# Patient Record
Sex: Female | Born: 1960 | Race: White | Hispanic: No | Marital: Single | Smoking: Former smoker
Health system: Southern US, Community
[De-identification: ages and names within clinical notes are randomized; demographics above are authoritative.]

## PROBLEM LIST (undated history)

## (undated) DIAGNOSIS — E039 Hypothyroidism, unspecified: Secondary | ICD-10-CM

## (undated) DIAGNOSIS — E559 Vitamin D deficiency, unspecified: Secondary | ICD-10-CM

## (undated) DIAGNOSIS — E063 Autoimmune thyroiditis: Secondary | ICD-10-CM

## (undated) DIAGNOSIS — C50919 Malignant neoplasm of unspecified site of unspecified female breast: Secondary | ICD-10-CM

## (undated) DIAGNOSIS — D0512 Intraductal carcinoma in situ of left breast: Secondary | ICD-10-CM

## (undated) DIAGNOSIS — Z923 Personal history of irradiation: Secondary | ICD-10-CM

## (undated) HISTORY — PX: COLONOSCOPY: SHX174

---
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
## 2005-02-16 HISTORY — PX: BREAST CYST ASPIRATION: SHX578

## 2006-03-28 HISTORY — PX: CERVICAL POLYPECTOMY: SHX88

## 2006-08-27 ENCOUNTER — Ambulatory Visit: Payer: Self-pay | Admitting: Orthopaedic Surgery

## 2009-10-12 ENCOUNTER — Ambulatory Visit: Payer: Self-pay | Admitting: Obstetrics and Gynecology

## 2009-10-26 ENCOUNTER — Ambulatory Visit: Payer: Self-pay | Admitting: Obstetrics and Gynecology

## 2009-10-29 ENCOUNTER — Ambulatory Visit: Payer: Self-pay | Admitting: Obstetrics and Gynecology

## 2010-11-29 ENCOUNTER — Ambulatory Visit: Payer: Self-pay | Admitting: Obstetrics and Gynecology

## 2012-03-29 ENCOUNTER — Ambulatory Visit: Payer: Self-pay | Admitting: Obstetrics and Gynecology

## 2013-04-16 ENCOUNTER — Ambulatory Visit: Payer: Self-pay | Admitting: Obstetrics and Gynecology

## 2014-05-29 ENCOUNTER — Ambulatory Visit: Payer: Self-pay | Admitting: Obstetrics and Gynecology

## 2015-07-05 DIAGNOSIS — E538 Deficiency of other specified B group vitamins: Secondary | ICD-10-CM | POA: Insufficient documentation

## 2015-07-05 DIAGNOSIS — E039 Hypothyroidism, unspecified: Secondary | ICD-10-CM | POA: Insufficient documentation

## 2015-07-05 DIAGNOSIS — E559 Vitamin D deficiency, unspecified: Secondary | ICD-10-CM | POA: Insufficient documentation

## 2015-08-11 ENCOUNTER — Other Ambulatory Visit: Payer: Self-pay | Admitting: Obstetrics and Gynecology

## 2015-08-11 DIAGNOSIS — Z1231 Encounter for screening mammogram for malignant neoplasm of breast: Secondary | ICD-10-CM

## 2015-08-31 ENCOUNTER — Ambulatory Visit
Admission: RE | Admit: 2015-08-31 | Discharge: 2015-08-31 | Disposition: A | Discharge status: Home / Self Care | Payer: BLUE CROSS/BLUE SHIELD | Source: Ambulatory Visit | Attending: Obstetrics and Gynecology | Admitting: Obstetrics and Gynecology

## 2015-08-31 DIAGNOSIS — Z1231 Encounter for screening mammogram for malignant neoplasm of breast: Secondary | ICD-10-CM | POA: Diagnosis not present

## 2016-10-02 ENCOUNTER — Other Ambulatory Visit: Payer: Self-pay | Admitting: Obstetrics and Gynecology

## 2016-10-02 DIAGNOSIS — Z1231 Encounter for screening mammogram for malignant neoplasm of breast: Secondary | ICD-10-CM

## 2016-10-20 ENCOUNTER — Ambulatory Visit
Admission: RE | Admit: 2016-10-20 | Discharge: 2016-10-20 | Disposition: A | Discharge status: Home / Self Care | Payer: BLUE CROSS/BLUE SHIELD | Source: Ambulatory Visit | Attending: Obstetrics and Gynecology | Admitting: Obstetrics and Gynecology

## 2016-10-20 DIAGNOSIS — Z1231 Encounter for screening mammogram for malignant neoplasm of breast: Secondary | ICD-10-CM | POA: Diagnosis not present

## 2017-03-30 ENCOUNTER — Ambulatory Visit: Admit: 2017-03-30 | Payer: BLUE CROSS/BLUE SHIELD | Admitting: Unknown Physician Specialty

## 2017-03-30 SURGERY — COLONOSCOPY WITH PROPOFOL
Anesthesia: General

## 2017-04-17 DIAGNOSIS — Z923 Personal history of irradiation: Secondary | ICD-10-CM

## 2017-04-17 HISTORY — DX: Personal history of irradiation: Z92.3

## 2017-09-13 DIAGNOSIS — M545 Low back pain, unspecified: Secondary | ICD-10-CM | POA: Insufficient documentation

## 2017-09-13 DIAGNOSIS — D126 Benign neoplasm of colon, unspecified: Secondary | ICD-10-CM | POA: Insufficient documentation

## 2017-10-24 ENCOUNTER — Other Ambulatory Visit: Payer: Self-pay | Admitting: Obstetrics and Gynecology

## 2017-10-24 DIAGNOSIS — Z1231 Encounter for screening mammogram for malignant neoplasm of breast: Secondary | ICD-10-CM

## 2017-11-22 ENCOUNTER — Ambulatory Visit
Admission: RE | Admit: 2017-11-22 | Discharge: 2017-11-22 | Disposition: A | Discharge status: Home / Self Care | Payer: BLUE CROSS/BLUE SHIELD | Source: Ambulatory Visit | Attending: Obstetrics and Gynecology | Admitting: Obstetrics and Gynecology

## 2017-11-22 DIAGNOSIS — Z1231 Encounter for screening mammogram for malignant neoplasm of breast: Secondary | ICD-10-CM | POA: Diagnosis present

## 2017-11-27 ENCOUNTER — Other Ambulatory Visit: Payer: Self-pay | Admitting: Obstetrics and Gynecology

## 2017-11-27 DIAGNOSIS — R928 Other abnormal and inconclusive findings on diagnostic imaging of breast: Secondary | ICD-10-CM

## 2017-11-27 DIAGNOSIS — R921 Mammographic calcification found on diagnostic imaging of breast: Secondary | ICD-10-CM

## 2017-12-05 ENCOUNTER — Ambulatory Visit
Admission: RE | Admit: 2017-12-05 | Discharge: 2017-12-05 | Disposition: A | Discharge status: Home / Self Care | Payer: BLUE CROSS/BLUE SHIELD | Source: Ambulatory Visit | Attending: Obstetrics and Gynecology | Admitting: Obstetrics and Gynecology

## 2017-12-05 DIAGNOSIS — R928 Other abnormal and inconclusive findings on diagnostic imaging of breast: Secondary | ICD-10-CM

## 2017-12-05 DIAGNOSIS — R921 Mammographic calcification found on diagnostic imaging of breast: Secondary | ICD-10-CM

## 2017-12-07 ENCOUNTER — Other Ambulatory Visit: Payer: Self-pay | Admitting: Obstetrics and Gynecology

## 2017-12-07 DIAGNOSIS — R928 Other abnormal and inconclusive findings on diagnostic imaging of breast: Secondary | ICD-10-CM

## 2017-12-13 ENCOUNTER — Ambulatory Visit
Admission: RE | Admit: 2017-12-13 | Discharge: 2017-12-13 | Disposition: A | Discharge status: Home / Self Care | Payer: BLUE CROSS/BLUE SHIELD | Source: Ambulatory Visit | Attending: Obstetrics and Gynecology | Admitting: Obstetrics and Gynecology

## 2017-12-13 DIAGNOSIS — R928 Other abnormal and inconclusive findings on diagnostic imaging of breast: Secondary | ICD-10-CM | POA: Insufficient documentation

## 2017-12-13 HISTORY — PX: BREAST BIOPSY: SHX20

## 2017-12-14 ENCOUNTER — Other Ambulatory Visit: Payer: Self-pay | Admitting: Internal Medicine

## 2017-12-14 NOTE — Progress Notes (Unsigned)
mdt  

## 2017-12-16 DIAGNOSIS — D0512 Intraductal carcinoma in situ of left breast: Secondary | ICD-10-CM

## 2017-12-16 HISTORY — DX: Intraductal carcinoma in situ of left breast: D05.12

## 2017-12-18 ENCOUNTER — Other Ambulatory Visit: Payer: Self-pay | Admitting: *Deleted

## 2017-12-18 ENCOUNTER — Encounter: Payer: Self-pay | Admitting: *Deleted

## 2017-12-18 DIAGNOSIS — D0512 Intraductal carcinoma in situ of left breast: Secondary | ICD-10-CM

## 2017-12-18 NOTE — Progress Notes (Signed)
  Oncology Nurse Navigator Documentation  Navigator Location: [facility] (12/18/17 1400) Referral date to RadOnc/MedOnc: 01/02/18 (12/18/17 1400) )Navigator Encounter Type: Introductory phone call (12/18/17 1400)   Abnormal Finding Date: 12/05/17 (12/18/17 1400) Confirmed Diagnosis Date: 12/14/17 (12/18/17 1400)                   Barriers/Navigation Needs: Coordination of Care (12/18/17 1400)   Interventions: Education (12/18/17 1400)                      Time Spent with Patient: 60 (12/18/17 1400)   Called patient to establish navigation services.  She is newly diagnosed with DCIS.  Patient had questions about her diagnosis.  Answered questions.  She would like to wait until she returns from vacation on 01/02/18 for any surgical or medical oncology consults.  She is going to [place] on Saturday, and states she is too busy this week and would like not to have to think about it while on vacation.  I have scheduled her to see Dr. Tasia Catchings on 01/02/18 @ 10:45 and Dr. Lysle Pearl at 1:30.  She is to take a photo ID, insurance card and list of all meds.  I will give her her educational material at her visit with Dr. Tasia Catchings.  She is to call with any questions or needs.

## 2018-01-02 ENCOUNTER — Inpatient Hospital Stay: Payer: BLUE CROSS/BLUE SHIELD | Attending: Oncology | Admitting: Oncology

## 2018-01-02 ENCOUNTER — Encounter (INDEPENDENT_AMBULATORY_CARE_PROVIDER_SITE_OTHER): Payer: Self-pay

## 2018-01-02 ENCOUNTER — Other Ambulatory Visit: Payer: Self-pay

## 2018-01-02 ENCOUNTER — Encounter: Payer: Self-pay | Admitting: Oncology

## 2018-01-02 VITALS — BP 138/83 | HR 84 | Temp 96.5°F | Resp 18 | Ht 65.0 in | Wt 170.0 lb

## 2018-01-02 DIAGNOSIS — Z808 Family history of malignant neoplasm of other organs or systems: Secondary | ICD-10-CM | POA: Diagnosis not present

## 2018-01-02 DIAGNOSIS — Z8041 Family history of malignant neoplasm of ovary: Secondary | ICD-10-CM | POA: Insufficient documentation

## 2018-01-02 DIAGNOSIS — Z800 Family history of malignant neoplasm of digestive organs: Secondary | ICD-10-CM | POA: Diagnosis not present

## 2018-01-02 DIAGNOSIS — Z87891 Personal history of nicotine dependence: Secondary | ICD-10-CM | POA: Insufficient documentation

## 2018-01-02 DIAGNOSIS — Z803 Family history of malignant neoplasm of breast: Secondary | ICD-10-CM | POA: Diagnosis not present

## 2018-01-02 DIAGNOSIS — D0512 Intraductal carcinoma in situ of left breast: Secondary | ICD-10-CM | POA: Insufficient documentation

## 2018-01-02 DIAGNOSIS — Z801 Family history of malignant neoplasm of trachea, bronchus and lung: Secondary | ICD-10-CM

## 2018-01-02 DIAGNOSIS — Z809 Family history of malignant neoplasm, unspecified: Secondary | ICD-10-CM

## 2018-01-02 NOTE — Progress Notes (Signed)
Patient here for initial visit. °

## 2018-01-02 NOTE — Progress Notes (Signed)
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  female with PMH listed below who was referred to me for evaluation of  Patient had screening mammogram on 11/22/2017 which showed suspicious calcifications.  She underwent digital diagnostic mammogram on 12/05/2017 which confirmed grouped fine pleomorphic calcifications within the upper outer quadrant of the left breast.  Spanning 8 mm.  Patient underwent stereotactic biopsy of left breast calcification. Biopsy pathology showed: High-grade ductal carcinoma in situ, comedo type with associated calcifications.  Nipple discharge: Denies Family history: Maternal grandmother deceased from lung and the brain cancer, maternal uncle deceased from lung cancer.  Maternal uncle deceased from esophageal cancer.  Another maternal uncle deceased from liver cancer.  Negative for breast cancer family history.  OCP use: used OCP between age of [age].  Estrogen and progesterone therapy: denies History of radiation to chest: denies.  Previous breast surgery: History of breast cyst aspiration.  She was accompanied by her daughter.   Review of Systems  Constitutional: Negative for chills, fever, malaise/fatigue and weight loss.  HENT: Negative for nosebleeds and sore throat.   Eyes: Negative for double vision, photophobia and redness.  Respiratory: Negative for cough, shortness of breath and wheezing.   Cardiovascular: Negative for chest pain, palpitations and orthopnea.  Gastrointestinal: Negative for abdominal pain, blood in stool, nausea and vomiting.  Genitourinary: Negative for dysuria.  Musculoskeletal: Negative for back pain, myalgias and neck pain.    Skin: Negative for itching and rash.  Neurological: Negative for dizziness, tingling and tremors.  Endo/Heme/Allergies: Negative for environmental allergies. Does not bruise/bleed easily.  Psychiatric/Behavioral: Negative for depression. The patient is nervous/anxious.     MEDICAL HISTORY:  Past Medical History:  Diagnosis Date  . Tuberculosis     SURGICAL HISTORY: Past Surgical History:  Procedure Laterality Date  . BREAST BIOPSY Left 12/13/2017   affirm stereo biopsy/path pending  . BREAST CYST ASPIRATION Left     SOCIAL HISTORY: Social History   Socioeconomic History  . Marital status: Married    Spouse name: Not on file  . Number of children: Not on file  . Years of education: Not on file  . Highest education level: Not on file  Occupational History  . Not on file  Social Needs  . Financial resource strain: Not on file  . Food insecurity:    Worry: Not on file    Inability: Not on file  . Transportation needs:    Medical: Not on file    Non-medical: Not on file  Tobacco Use  . Smoking status: Former Smoker    Packs/day: 1.00    Years: 8.00    Pack years: 8.00    Last attempt to quit: 01/03/1988    Years since quitting: 30.0  . Smokeless tobacco: Never Used  Substance and Sexual Activity  . Alcohol use: Never    Frequency: Never  . Drug use: Never  . Sexual activity: Not on file  Lifestyle  . Physical activity:    Days per week: Not on file    Minutes per session: Not on file  . Stress: Not on file  Relationships  . Social connections:    Talks on phone: Not on file    Gets together: Not  on file    Attends religious service: Not on file    Active member of club or organization: Not on file    Attends meetings of clubs or organizations: Not on file    Relationship status: Not on file  . Intimate partner violence:    Fear of current or ex partner: Not on file    Emotionally abused: Not on file    Physically abused: Not on file    Forced sexual  activity: Not on file  Other Topics Concern  . Not on file  Social History Narrative  . Not on file    FAMILY HISTORY: Family History  Problem Relation Age of Onset  . Hypertension Mother   . Congestive Heart Failure Father   . Lung cancer Maternal Uncle   . Lung cancer Maternal Grandmother   . Brain cancer Maternal Grandmother   . Esophageal cancer Maternal Uncle   . Liver cancer Maternal Uncle   . Breast cancer Neg Hx     ALLERGIES:  is allergic to sulfa antibiotics.  MEDICATIONS:  Current Outpatient Medications  Medication Sig Dispense Refill  . ALPRAZolam (XANAX) 0.5 MG tablet Take by mouth. take 1 tablet (0.5 mg total) by mouth nightly as needed    . Cholecalciferol (VITAMIN D3) 2000 units capsule Take by mouth. Take 2,000 Units by mouth once daily    . etodolac (LODINE) 400 MG tablet TAKE 1 TABLET (400 MG TOTAL) BY MOUTH 2 (TWO) TIMES DAILY TAKE WITH FOOD  3  . levothyroxine (SYNTHROID, LEVOTHROID) 75 MCG tablet TAKE 1 TAB BY MOUTH ONCE DAILY ON AN EMPTY STOMACH WITH A GLASS OF WATER 30-60 MINS BEFORE BREAKFAST    . vitamin B-12 (CYANOCOBALAMIN) 1000 MCG tablet Take by mouth. Take 1,000 mcg by mouth once daily     No current facility-administered medications for this visit.      PHYSICAL EXAMINATION: ECOG PERFORMANCE STATUS: 0 - Asymptomatic Vitals:   01/02/18 1105  BP: 138/83  Pulse: 84  Resp: 18  Temp: (!) 96.5 F (35.8 C)   Filed Weights   01/02/18 1105  Weight: 170 lb (77.1 kg)    Physical Exam  Constitutional: She is oriented to person, place, and time. No distress.  HENT:  Head: Normocephalic and atraumatic.  Mouth/Throat: Oropharynx is clear and moist.  Eyes: Pupils are equal, round, and reactive to light. EOM are normal. No scleral icterus.  Neck: Normal range of motion. Neck supple.  Cardiovascular: Normal rate, regular rhythm and normal heart sounds.  Pulmonary/Chest: Effort normal. No respiratory distress. She has no wheezes.  Abdominal:  Soft. Bowel sounds are normal. She exhibits no distension and no mass. There is no tenderness.  Musculoskeletal: Normal range of motion. She exhibits no edema or deformity.  Neurological: She is alert and oriented to person, place, and time. No cranial nerve deficit. Coordination normal.  Skin: Skin is warm and dry. No rash noted. No erythema.  Psychiatric: She has a normal mood and affect. Her behavior is normal. Thought content normal.  Breast exam was performed in seated and lying down position. No evidence of any palpable masses bilaterally.  No evidence of bilateral axillary adenopathy.    LABORATORY DATA:  I have reviewed the data as listed No results found for: WBC, HGB, HCT, MCV, PLT No results for input(s): NA, K, CL, CO2, GLUCOSE, BUN, CREATININE, CALCIUM, GFRNONAA, GFRAA, PROT, ALBUMIN, AST, ALT, ALKPHOS, BILITOT, BILIDIR, IBILI in the last 8760 hours. Iron/TIBC/Ferritin/ %Sat No results found for: IRON,  TIBC, FERRITIN, IRONPCTSAT   I have reviewed patient's recent labs done at [facility] clinic.  Cbc hemoglobin 14.8, hematocrit 43.7, platelet counts 227, WBC 5.7.  Normal differential. Normal creatinine and bilirubin level.  ASSESSMENT & PLAN:  1. Ductal carcinoma in situ (DCIS) of left breast   2. Family history of cancer    Mammogram image was independently reviewed by me and discussed with patient. Pathology reports reviewed and discussed.  The diagnosis and care plan were discussed with patient in detail.  NCCN guidelines were reviewed and shared with patient. Recommend lumpectomy followed by adjuvant treatments.  Final plan pending on lumpectomy pathology.  She has an appointment with Dr.Sakai this afternoon.  Family history of cancer, patient reports that her maternal grandmother had cancer started from her ovary and metastasis to lung and brain.  Discussed about referral for genetic counseling. She declined.  We spent sufficient time to discuss many aspect of care,  questions were answered to patient's satisfaction.   We spent sufficient time to discuss many aspect of care, questions were answered to patient's satisfaction.  The patient knows to call the clinic with any problems questions or concerns.  Return of visit: to be determined. Will be coordinated by RN navigator [place].   Thank you for this kind referral and the opportunity to participate in the care of this patient. A copy of today's note is routed to referring provider  Total face to face encounter time for this patient visit was 60 min. >50% of the time was  spent in counseling and coordination of care.    Earlie Server, MD, PhD Hematology Oncology [facility] Pager- [phone] 01/02/2018

## 2018-01-03 ENCOUNTER — Other Ambulatory Visit: Payer: Self-pay | Admitting: Surgery

## 2018-01-03 DIAGNOSIS — D0512 Intraductal carcinoma in situ of left breast: Secondary | ICD-10-CM

## 2018-01-04 ENCOUNTER — Ambulatory Visit: Payer: Self-pay | Admitting: Surgery

## 2018-01-04 NOTE — H&P (Signed)
Subjective:   CC: Ductal carcinoma in situ (DCIS) of left breast [D05.12] HPI:  Victoria Hammond is a 57 y.o. female who was referred by Remi Haggard* for evaluation of above. Change was noted on most recent mammogram. Patient does routinely do self breast exams.  Patient has not noted a change on breast exam. Age of menarche was 79. Perimenopausal.  Patient reports OCP use but no HRT. Patient is G1P1. Age of first live birth was 64. Patient did breast feed. Patient denies nipple discharge. Patient reports previous left breast cyst aspiration, negative for malignancy.  Patient denies a personal history of breast cancer.   Past Medical History:  has a past medical history of Acquired hypothyroidism, unspecified (07/05/2015), Breast cyst, Cervical polyp, Goiter, Hashimoto's thyroiditis, History of chickenpox, History of right tennis elbow, HSV (herpes simplex virus) infection, OAB (overactive bladder), Rotator cuff syndrome, right, and Vitamin D insufficiency.  Past Surgical History:  has a past surgical history that includes Aspiration of breast cyst (02/16/2005); Cervical polypectomy and biopsy (03/28/2006); and Colonoscopy (05/15/2017).  Family History: family history includes Cancer in her other; Diabetes in her other; Esophageal cancer in her other; Heart disease in her other; High blood pressure (Hypertension) in her brother, mother, and other; Hypothyroidism in her father; Myocardial Infarction (Heart attack) in her father; Ovarian cancer in her maternal grandmother.  Social History:  reports that she has never smoked. She has never used smokeless tobacco. She reports that she drinks alcohol. She reports that she does not use drugs.  Current Medications: has a current medication list which includes the following prescription(s): alprazolam, cholecalciferol, cyanocobalamin, etodolac, levothyroxine, and cyclobenzaprine.  Allergies:       Allergies as of 01/02/2018 - Reviewed  01/02/2018  Allergen Reaction Noted  . Amoxicillin Nausea 02/02/2014  . Aspirin Nausea 02/02/2014  . Codeine sulfate Nausea and Vomiting 02/02/2014  . Septra [sulfamethoxazole-trimethoprim] Unknown 02/02/2014  . Sulfa (sulfonamide antibiotics) Unknown 02/02/2014    ROS:  A 15 point review of systems was performed and was negative except as noted in HPI   Objective:   BP 128/79   Pulse 82   Temp 36.8 C (98.2 F) (Oral)   Ht 165.1 cm (5\' 5" )   Wt 77.2 kg (170 lb 3.2 oz)   LMP 03/15/2014 (Exact Date)   BMI 28.32 kg/m   Constitutional :  alert, appears stated age, cooperative and no distress  Lymphatics/Throat:  no asymmetry, masses, or scars  Respiratory:  clear to auscultation bilaterally  Cardiovascular:  regular rate and rhythm  Gastrointestinal: soft, non-tender; bowel sounds normal; no masses,  no organomegaly.   Musculoskeletal: Steady gait and movement  Skin: Cool and moist  Psychiatric: Normal affect, non-agitated, not confused  Breast:  Chaperone present for exam.  breasts appear normal, no suspicious masses, no skin or nipple changes or axillary nodes.    LABS:  Surgical Pathology CASE: [id number] PATIENT: Victoria Hammond Surgical Pathology Report     SPECIMEN SUBMITTED: A. Breast, left  CLINICAL HISTORY: Calcs  PRE-OPERATIVE DIAGNOSIS: DCIS vs FA  POST-OPERATIVE DIAGNOSIS: None provided.     DIAGNOSIS: A. BREAST CALCIFICATIONS, LEFT CENTRAL SUPERIOR; STEREOTACTIC BIOPSY: - HIGH-GRADE DUCTAL CARCINOMA IN SITU, COMEDO TYPE WITH ASSOCIATED CALCIFICATIONS.  Comment: DCIS is present in 6 of 7 blocks with the largest linear focus measuring 62mm.ER and PR testing is deferred to an excision specimen. These findings were communicated to [facility] and Dr. Terrilyn Saver office on 12/14/2017.Read back procedure was performed.   GROSS DESCRIPTION: A. Labeled: Left  breast superior calcification central Received: in a formalin-filled Brevera collection  device Accompanying specimen radiograph: Yes Time/Date in fixative: 1:40 PM on 12/13/2017 Cold ischemic time: Less than 5 minutes Total fixation time: 6.9 hours Core pieces: Multiple Measurement: Aggregate, 7.3 x 1.5 x 0.2 cm Description / comments: Yellow lobulated fibrofatty Inked: Black Entirely submitted in cassette(s):  1 - section A 2 - section B 3 - section G 4 - section H 5 - section I 6 - section L 7 - remaining tissue  Final Diagnosis performed by Quay Burow, MD. Electronically signed 12/14/2017 3:02:41PM The electronic signature indicates that the named Attending Pathologist has evaluated the specimen  Technical component performed at [facility], [address] [place], [place] [address] Lab: [phone] Dir: Rush Farmer, MD, MMMProfessional component performed at [facility], [address], [place], [place], [place] [address] Lab: [phone] Dir: Dellia Nims. Rubinas, MD   RADS: See above  Assessment:   Ductal carcinoma in situ (DCIS) of left breast [D05.12]  Plan:   1. Ductal carcinoma in situ (DCIS) of left breast [D05.12]  Discussed the risk of surgery including recurrence, chronic pain, post-op infxn, poor/delayed wound healing, poor cosmesis, seroma, hematoma formation, and possible re-operation to address said risks. The risks of general anesthetic, if used, includes MI, CVA, sudden death or even reaction to anesthetic medications also discussed.  Typical post-op recovery time and possbility of activity restrictions were also discussed.  Alternatives include continued observation.  Benefits include possible symptom relief, pathologic evaluation, and/or curative excision.   The patient verbalized understanding and all questions were answered to the patient's satisfaction.  2. Patient has elected to proceed with surgical treatment, partial mastectomy with needle localzation.  No need for SLNB since this is DCIS.   Procedure will  be scheduled.  Written consent was obtained.     Electronically signed by Benjamine Sprague, DO on 01/02/2018 2:51 PM

## 2018-01-04 NOTE — H&P (View-Only) (Signed)
Subjective:   CC: Ductal carcinoma in situ (DCIS) of left breast [D05.12] HPI:  Victoria Hammond is a 57 y.o. female who was referred by Remi Haggard* for evaluation of above. Change was noted on most recent mammogram. Patient does routinely do self breast exams.  Patient has not noted a change on breast exam. Age of menarche was 70. Perimenopausal.  Patient reports OCP use but no HRT. Patient is G1P1. Age of first live birth was 25. Patient did breast feed. Patient denies nipple discharge. Patient reports previous left breast cyst aspiration, negative for malignancy.  Patient denies a personal history of breast cancer.   Past Medical History:  has a past medical history of Acquired hypothyroidism, unspecified (07/05/2015), Breast cyst, Cervical polyp, Goiter, Hashimoto's thyroiditis, History of chickenpox, History of right tennis elbow, HSV (herpes simplex virus) infection, OAB (overactive bladder), Rotator cuff syndrome, right, and Vitamin D insufficiency.  Past Surgical History:  has a past surgical history that includes Aspiration of breast cyst (02/16/2005); Cervical polypectomy and biopsy (03/28/2006); and Colonoscopy (05/15/2017).  Family History: family history includes Cancer in her other; Diabetes in her other; Esophageal cancer in her other; Heart disease in her other; High blood pressure (Hypertension) in her brother, mother, and other; Hypothyroidism in her father; Myocardial Infarction (Heart attack) in her father; Ovarian cancer in her maternal grandmother.  Social History:  reports that she has never smoked. She has never used smokeless tobacco. She reports that she drinks alcohol. She reports that she does not use drugs.  Current Medications: has a current medication list which includes the following prescription(s): alprazolam, cholecalciferol, cyanocobalamin, etodolac, levothyroxine, and cyclobenzaprine.  Allergies:       Allergies as of 01/02/2018 - Reviewed  01/02/2018  Allergen Reaction Noted  . Amoxicillin Nausea 02/02/2014  . Aspirin Nausea 02/02/2014  . Codeine sulfate Nausea and Vomiting 02/02/2014  . Septra [sulfamethoxazole-trimethoprim] Unknown 02/02/2014  . Sulfa (sulfonamide antibiotics) Unknown 02/02/2014    ROS:  A 15 point review of systems was performed and was negative except as noted in HPI   Objective:   BP 128/79   Pulse 82   Temp 36.8 C (98.2 F) (Oral)   Ht 165.1 cm (5\' 5" )   Wt 77.2 kg (170 lb 3.2 oz)   LMP 03/15/2014 (Exact Date)   BMI 28.32 kg/m   Constitutional :  alert, appears stated age, cooperative and no distress  Lymphatics/Throat:  no asymmetry, masses, or scars  Respiratory:  clear to auscultation bilaterally  Cardiovascular:  regular rate and rhythm  Gastrointestinal: soft, non-tender; bowel sounds normal; no masses,  no organomegaly.   Musculoskeletal: Steady gait and movement  Skin: Cool and moist  Psychiatric: Normal affect, non-agitated, not confused  Breast:  Chaperone present for exam.  breasts appear normal, no suspicious masses, no skin or nipple changes or axillary nodes.    LABS:  Surgical Pathology CASE: [id number] PATIENT: Victoria Hammond Surgical Pathology Report     SPECIMEN SUBMITTED: A. Breast, left  CLINICAL HISTORY: Calcs  PRE-OPERATIVE DIAGNOSIS: DCIS vs FA  POST-OPERATIVE DIAGNOSIS: None provided.     DIAGNOSIS: A. BREAST CALCIFICATIONS, LEFT CENTRAL SUPERIOR; STEREOTACTIC BIOPSY: - HIGH-GRADE DUCTAL CARCINOMA IN SITU, COMEDO TYPE WITH ASSOCIATED CALCIFICATIONS.  Comment: DCIS is present in 6 of 7 blocks with the largest linear focus measuring 40mm.ER and PR testing is deferred to an excision specimen. These findings were communicated to [facility] and Dr. Terrilyn Saver office on 12/14/2017.Read back procedure was performed.   GROSS DESCRIPTION: A. Labeled: Left  breast superior calcification central Received: in a formalin-filled Brevera collection  device Accompanying specimen radiograph: Yes Time/Date in fixative: 1:40 PM on 12/13/2017 Cold ischemic time: Less than 5 minutes Total fixation time: 6.9 hours Core pieces: Multiple Measurement: Aggregate, 7.3 x 1.5 x 0.2 cm Description / comments: Yellow lobulated fibrofatty Inked: Black Entirely submitted in cassette(s):  1 - section A 2 - section B 3 - section G 4 - section H 5 - section I 6 - section L 7 - remaining tissue  Final Diagnosis performed by Quay Burow, MD. Electronically signed 12/14/2017 3:02:41PM The electronic signature indicates that the named Attending Pathologist has evaluated the specimen  Technical component performed at [facility], [address], [place], [place] [address] Lab: [phone] Dir: Rush Farmer, MD, MMMProfessional component performed at [facility], [facility], [place], [place], [place] [address] Lab: [phone] Dir: Dellia Nims. Rubinas, MD   RADS: See above  Assessment:   Ductal carcinoma in situ (DCIS) of left breast [D05.12]  Plan:   1. Ductal carcinoma in situ (DCIS) of left breast [D05.12]  Discussed the risk of surgery including recurrence, chronic pain, post-op infxn, poor/delayed wound healing, poor cosmesis, seroma, hematoma formation, and possible re-operation to address said risks. The risks of general anesthetic, if used, includes MI, CVA, sudden death or even reaction to anesthetic medications also discussed.  Typical post-op recovery time and possbility of activity restrictions were also discussed.  Alternatives include continued observation.  Benefits include possible symptom relief, pathologic evaluation, and/or curative excision.   The patient verbalized understanding and all questions were answered to the patient's satisfaction.  2. Patient has elected to proceed with surgical treatment, partial mastectomy with needle localzation.  No need for SLNB since this is DCIS.   Procedure will  be scheduled.  Written consent was obtained.     Electronically signed by Benjamine Sprague, DO on 01/02/2018 2:51 PM

## 2018-01-08 ENCOUNTER — Encounter
Admission: RE | Admit: 2018-01-08 | Discharge: 2018-01-08 | Disposition: A | Discharge status: Home / Self Care | Payer: BLUE CROSS/BLUE SHIELD | Source: Ambulatory Visit | Attending: Surgery | Admitting: Surgery

## 2018-01-08 ENCOUNTER — Other Ambulatory Visit: Payer: Self-pay

## 2018-01-08 DIAGNOSIS — Z01818 Encounter for other preprocedural examination: Secondary | ICD-10-CM | POA: Diagnosis not present

## 2018-01-08 HISTORY — DX: Hypothyroidism, unspecified: E03.9

## 2018-01-08 HISTORY — DX: Vitamin D deficiency, unspecified: E55.9

## 2018-01-08 HISTORY — DX: Autoimmune thyroiditis: E06.3

## 2018-01-08 HISTORY — DX: Intraductal carcinoma in situ of left breast: D05.12

## 2018-01-08 NOTE — Patient Instructions (Signed)
Your procedure is scheduled on: Friday, January 11, 2018 Report to Day Surgery on the 2nd floor of the [facility]. To find out your arrival time, please call [phone] between 1PM - 3PM on: Thursday, January 10, 2018  REMEMBER: Instructions that are not followed completely may result in serious medical risk, up to and including death; or upon the discretion of your surgeon and anesthesiologist your surgery may need to be rescheduled.  Do not eat food after midnight the night before surgery.  No gum chewing, lozengers or hard candies.  You may however, drink CLEAR liquids up to 2 hours before you are scheduled to arrive for your surgery. Do not drink anything within 2 hours of the start of your surgery.  Clear liquids include: - water  - apple juice without pulp - gatorade - black coffee or tea (Do NOT add milk or creamers to the coffee or tea) Do NOT drink anything that is not on this list.  No Alcohol for 24 hours before or after surgery.  No Smoking including e-cigarettes for 24 hours prior to surgery.  No chewable tobacco products for at least 6 hours prior to surgery.  No nicotine patches on the day of surgery.  On the morning of surgery brush your teeth with toothpaste and water, you may rinse your mouth with mouthwash if you wish. Do not swallow any toothpaste or mouthwash.  Notify your doctor if there is any change in your medical condition (cold, fever, infection).  Do not wear jewelry, make-up, hairpins, clips or nail polish.  Do not wear lotions, powders, or perfumes. You may wear deodorant.  Do not shave 48 hours prior to surgery.  Contacts and dentures may not be worn into surgery.  Do not bring valuables to the hospital, including drivers license, insurance or credit cards.  [facility] is not responsible for any belongings or valuables.   TAKE THESE MEDICATIONS THE MORNING OF SURGERY:  1.  levothyroxine  Use CHG Soap as directed on instruction  sheet.  NOW!  Stop Anti-inflammatories (NSAIDS) such as etodolac, Advil, Aleve, Ibuprofen, Motrin, Naproxen, Naprosyn and Aspirin based products such as Excedrin, Goodys Powder, BC Powder. (May take Tylenol or Acetaminophen if needed.)  NOW! Stop ANY OVER THE COUNTER supplements until after surgery. (May continue Vitamin D, Vitamin B.)  Wear comfortable clothing (specific to your surgery type) to the hospital.  Plan for stool softeners for home use.  If you are being discharged the day of surgery, you will not be allowed to drive home. You will need a responsible adult to drive you home and stay with you that night.   If you are taking public transportation, you will need to have a responsible adult with you. Please confirm with your physician that it is acceptable to use public transportation.   Please call [phone] if you have any questions about these instructions.

## 2018-01-10 MED ORDER — CEFAZOLIN SODIUM-DEXTROSE 2-4 GM/100ML-% IV SOLN
2.0000 g | INTRAVENOUS | Status: AC
  Administered 2018-01-11: 2 g via INTRAVENOUS

## 2018-01-11 ENCOUNTER — Other Ambulatory Visit: Payer: Self-pay

## 2018-01-11 ENCOUNTER — Ambulatory Visit
Admission: RE | Admit: 2018-01-11 | Discharge: 2018-01-11 | Disposition: A | Discharge status: Home / Self Care | Payer: BLUE CROSS/BLUE SHIELD | Source: Ambulatory Visit | Attending: Surgery | Admitting: Surgery

## 2018-01-11 ENCOUNTER — Ambulatory Visit: Payer: BLUE CROSS/BLUE SHIELD | Admitting: Anesthesiology

## 2018-01-11 ENCOUNTER — Encounter
Admission: RE | Disposition: A | Discharge status: Home / Self Care | Payer: Self-pay | Source: Ambulatory Visit | Attending: Surgery

## 2018-01-11 DIAGNOSIS — E049 Nontoxic goiter, unspecified: Secondary | ICD-10-CM | POA: Diagnosis not present

## 2018-01-11 DIAGNOSIS — Z880 Allergy status to penicillin: Secondary | ICD-10-CM | POA: Diagnosis not present

## 2018-01-11 DIAGNOSIS — Z882 Allergy status to sulfonamides status: Secondary | ICD-10-CM | POA: Insufficient documentation

## 2018-01-11 DIAGNOSIS — Z833 Family history of diabetes mellitus: Secondary | ICD-10-CM | POA: Insufficient documentation

## 2018-01-11 DIAGNOSIS — Z885 Allergy status to narcotic agent status: Secondary | ICD-10-CM | POA: Insufficient documentation

## 2018-01-11 DIAGNOSIS — Z87891 Personal history of nicotine dependence: Secondary | ICD-10-CM | POA: Diagnosis not present

## 2018-01-11 DIAGNOSIS — D0512 Intraductal carcinoma in situ of left breast: Secondary | ICD-10-CM

## 2018-01-11 DIAGNOSIS — Z8249 Family history of ischemic heart disease and other diseases of the circulatory system: Secondary | ICD-10-CM | POA: Insufficient documentation

## 2018-01-11 DIAGNOSIS — Z800 Family history of malignant neoplasm of digestive organs: Secondary | ICD-10-CM | POA: Diagnosis not present

## 2018-01-11 DIAGNOSIS — Z79899 Other long term (current) drug therapy: Secondary | ICD-10-CM | POA: Diagnosis not present

## 2018-01-11 DIAGNOSIS — Z8041 Family history of malignant neoplasm of ovary: Secondary | ICD-10-CM | POA: Diagnosis not present

## 2018-01-11 DIAGNOSIS — Z888 Allergy status to other drugs, medicaments and biological substances status: Secondary | ICD-10-CM | POA: Diagnosis not present

## 2018-01-11 DIAGNOSIS — E063 Autoimmune thyroiditis: Secondary | ICD-10-CM | POA: Diagnosis not present

## 2018-01-11 DIAGNOSIS — Z809 Family history of malignant neoplasm, unspecified: Secondary | ICD-10-CM | POA: Diagnosis not present

## 2018-01-11 DIAGNOSIS — E039 Hypothyroidism, unspecified: Secondary | ICD-10-CM | POA: Insufficient documentation

## 2018-01-11 DIAGNOSIS — Z886 Allergy status to analgesic agent status: Secondary | ICD-10-CM | POA: Diagnosis not present

## 2018-01-11 DIAGNOSIS — N3281 Overactive bladder: Secondary | ICD-10-CM | POA: Diagnosis not present

## 2018-01-11 DIAGNOSIS — Z881 Allergy status to other antibiotic agents status: Secondary | ICD-10-CM | POA: Insufficient documentation

## 2018-01-11 DIAGNOSIS — E559 Vitamin D deficiency, unspecified: Secondary | ICD-10-CM | POA: Insufficient documentation

## 2018-01-11 HISTORY — PX: BREAST LUMPECTOMY: SHX2

## 2018-01-11 HISTORY — PX: PARTIAL MASTECTOMY WITH NEEDLE LOCALIZATION: SHX6008

## 2018-01-11 SURGERY — PARTIAL MASTECTOMY WITH NEEDLE LOCALIZATION
Anesthesia: General | Laterality: Left

## 2018-01-11 MED ORDER — SCOPOLAMINE 1 MG/3DAYS TD PT72
MEDICATED_PATCH | TRANSDERMAL | Status: AC
  Filled 2018-01-11: qty 1

## 2018-01-11 MED ORDER — SEVOFLURANE IN SOLN
RESPIRATORY_TRACT | Status: AC
  Filled 2018-01-11: qty 250

## 2018-01-11 MED ORDER — EPHEDRINE SULFATE 50 MG/ML IJ SOLN
INTRAMUSCULAR | Status: AC
  Filled 2018-01-11: qty 1

## 2018-01-11 MED ORDER — ACETAMINOPHEN 500 MG PO TABS
ORAL_TABLET | ORAL | Status: AC
  Administered 2018-01-11: 1000 mg via ORAL
  Filled 2018-01-11: qty 2

## 2018-01-11 MED ORDER — PROPOFOL 10 MG/ML IV BOLUS
INTRAVENOUS | Status: AC
  Filled 2018-01-11: qty 20

## 2018-01-11 MED ORDER — FAMOTIDINE 20 MG PO TABS
20.0000 mg | ORAL_TABLET | Freq: Once | ORAL | Status: AC
  Administered 2018-01-11: 20 mg via ORAL

## 2018-01-11 MED ORDER — LIDOCAINE HCL 1 % IJ SOLN
INTRAMUSCULAR | Status: DC | PRN
  Administered 2018-01-11: 2 mL via INTRAMUSCULAR

## 2018-01-11 MED ORDER — OXYCODONE HCL 5 MG/5ML PO SOLN: 5.0000 mg | Freq: Once | ORAL | Status: DC | PRN

## 2018-01-11 MED ORDER — ACETAMINOPHEN 325 MG PO TABS: 650.0000 mg | ORAL_TABLET | Freq: Three times a day (TID) | ORAL | 0 refills | Status: AC | PRN

## 2018-01-11 MED ORDER — ACETAMINOPHEN 500 MG PO TABS
1000.0000 mg | ORAL_TABLET | ORAL | Status: AC
  Administered 2018-01-11: 1000 mg via ORAL

## 2018-01-11 MED ORDER — ONDANSETRON HCL 4 MG/2ML IJ SOLN
INTRAMUSCULAR | Status: DC | PRN
  Administered 2018-01-11: 4 mg via INTRAVENOUS

## 2018-01-11 MED ORDER — PROPOFOL 10 MG/ML IV BOLUS
INTRAVENOUS | Status: DC | PRN
  Administered 2018-01-11: 200 mg via INTRAVENOUS

## 2018-01-11 MED ORDER — LIDOCAINE HCL (PF) 1 % IJ SOLN
INTRAMUSCULAR | Status: AC
  Filled 2018-01-11: qty 30

## 2018-01-11 MED ORDER — IBUPROFEN 800 MG PO TABS: 800.0000 mg | ORAL_TABLET | Freq: Three times a day (TID) | ORAL | 1 refills | Status: AC | PRN

## 2018-01-11 MED ORDER — LIDOCAINE HCL (CARDIAC) PF 100 MG/5ML IV SOSY
PREFILLED_SYRINGE | INTRAVENOUS | Status: DC | PRN
  Administered 2018-01-11: 100 mg via INTRAVENOUS

## 2018-01-11 MED ORDER — CEFAZOLIN SODIUM-DEXTROSE 2-4 GM/100ML-% IV SOLN
INTRAVENOUS | Status: AC
  Filled 2018-01-11: qty 100

## 2018-01-11 MED ORDER — HYDROCODONE-ACETAMINOPHEN 5-325 MG PO TABS: 1.0000 | ORAL_TABLET | Freq: Four times a day (QID) | ORAL | 0 refills | Status: AC | PRN

## 2018-01-11 MED ORDER — MIDAZOLAM HCL 2 MG/2ML IJ SOLN
INTRAMUSCULAR | Status: AC
  Filled 2018-01-11: qty 2

## 2018-01-11 MED ORDER — DOCUSATE SODIUM 100 MG PO CAPS: 100.0000 mg | ORAL_CAPSULE | Freq: Two times a day (BID) | ORAL | 0 refills | Status: AC | PRN

## 2018-01-11 MED ORDER — FENTANYL CITRATE (PF) 100 MCG/2ML IJ SOLN
INTRAMUSCULAR | Status: AC
  Filled 2018-01-11: qty 2

## 2018-01-11 MED ORDER — LIDOCAINE HCL (PF) 2 % IJ SOLN
INTRAMUSCULAR | Status: AC
  Filled 2018-01-11: qty 10

## 2018-01-11 MED ORDER — PROMETHAZINE HCL 25 MG/ML IJ SOLN: 6.2500 mg | INTRAMUSCULAR | Status: DC | PRN

## 2018-01-11 MED ORDER — FENTANYL CITRATE (PF) 100 MCG/2ML IJ SOLN
INTRAMUSCULAR | Status: DC | PRN
  Administered 2018-01-11: 50 ug via INTRAVENOUS
  Administered 2018-01-11 (×2): 25 ug via INTRAVENOUS

## 2018-01-11 MED ORDER — DEXAMETHASONE SODIUM PHOSPHATE 10 MG/ML IJ SOLN
INTRAMUSCULAR | Status: DC | PRN
  Administered 2018-01-11: 5 mg via INTRAVENOUS

## 2018-01-11 MED ORDER — ONDANSETRON HCL 4 MG/2ML IJ SOLN
INTRAMUSCULAR | Status: AC
  Filled 2018-01-11: qty 2

## 2018-01-11 MED ORDER — SCOPOLAMINE 1 MG/3DAYS TD PT72
1.0000 | MEDICATED_PATCH | TRANSDERMAL | Status: DC
  Administered 2018-01-11: 1.5 mg via TRANSDERMAL

## 2018-01-11 MED ORDER — FENTANYL CITRATE (PF) 100 MCG/2ML IJ SOLN: 25.0000 ug | INTRAMUSCULAR | Status: DC | PRN

## 2018-01-11 MED ORDER — MIDAZOLAM HCL 2 MG/2ML IJ SOLN
INTRAMUSCULAR | Status: DC | PRN
  Administered 2018-01-11: 2 mg via INTRAVENOUS

## 2018-01-11 MED ORDER — CHLORHEXIDINE GLUCONATE CLOTH 2 % EX PADS: 6.0000 | MEDICATED_PAD | Freq: Once | CUTANEOUS | Status: DC

## 2018-01-11 MED ORDER — FAMOTIDINE 20 MG PO TABS
ORAL_TABLET | ORAL | Status: AC
  Administered 2018-01-11: 20 mg via ORAL
  Filled 2018-01-11: qty 1

## 2018-01-11 MED ORDER — BUPIVACAINE HCL (PF) 0.5 % IJ SOLN
INTRAMUSCULAR | Status: AC
  Filled 2018-01-11: qty 30

## 2018-01-11 MED ORDER — SUCCINYLCHOLINE CHLORIDE 20 MG/ML IJ SOLN
INTRAMUSCULAR | Status: AC
  Filled 2018-01-11: qty 1

## 2018-01-11 MED ORDER — DEXAMETHASONE SODIUM PHOSPHATE 10 MG/ML IJ SOLN
INTRAMUSCULAR | Status: AC
  Filled 2018-01-11: qty 1

## 2018-01-11 MED ORDER — LACTATED RINGERS IV SOLN
INTRAVENOUS | Status: DC
  Administered 2018-01-11: 10:00:00 via INTRAVENOUS

## 2018-01-11 MED ORDER — PHENYLEPHRINE HCL 10 MG/ML IJ SOLN
INTRAMUSCULAR | Status: AC
  Filled 2018-01-11: qty 1

## 2018-01-11 MED ORDER — EPHEDRINE SULFATE 50 MG/ML IJ SOLN
INTRAMUSCULAR | Status: DC | PRN
  Administered 2018-01-11: 10 mg via INTRAVENOUS

## 2018-01-11 MED ORDER — OXYCODONE HCL 5 MG PO TABS: 5.0000 mg | ORAL_TABLET | Freq: Once | ORAL | Status: DC | PRN

## 2018-01-11 SURGICAL SUPPLY — 44 items
APPLIER CLIP 11 MED OPEN (CLIP)
BLADE SURG 15 STRL LF DISP TIS (BLADE) ×1 IMPLANT
BLADE SURG 15 STRL SS (BLADE) ×2
CANISTER SUCT 1200ML W/VALVE (MISCELLANEOUS) ×3 IMPLANT
CHLORAPREP W/TINT 26ML (MISCELLANEOUS) ×3 IMPLANT
CLIP APPLIE 11 MED OPEN (CLIP) IMPLANT
CNTNR SPEC 2.5X3XGRAD LEK (MISCELLANEOUS) ×1
CONT SPEC 4OZ STER OR WHT (MISCELLANEOUS) ×2
CONTAINER SPEC 2.5X3XGRAD LEK (MISCELLANEOUS) ×1 IMPLANT
COVER PROBE FLX POLY STRL (MISCELLANEOUS) IMPLANT
DERMABOND ADVANCED (GAUZE/BANDAGES/DRESSINGS) ×2
DERMABOND ADVANCED .7 DNX12 (GAUZE/BANDAGES/DRESSINGS) ×1 IMPLANT
DEVICE DUBIN SPECIMEN MAMMOGRA (MISCELLANEOUS) ×3 IMPLANT
DRAPE LAPAROTOMY TRNSV 106X77 (MISCELLANEOUS) ×3 IMPLANT
DRAPE SHEET LG 3/4 BI-LAMINATE (DRAPES) ×3 IMPLANT
ELECT CAUTERY BLADE TIP 2.5 (TIP) ×3
ELECT REM PT RETURN 9FT ADLT (ELECTROSURGICAL) ×3
ELECTRODE CAUTERY BLDE TIP 2.5 (TIP) ×1 IMPLANT
ELECTRODE REM PT RTRN 9FT ADLT (ELECTROSURGICAL) ×1 IMPLANT
GAUZE SPONGE 4X4 12PLY STRL (GAUZE/BANDAGES/DRESSINGS) IMPLANT
GLOVE BIOGEL PI IND STRL 7.0 (GLOVE) ×1 IMPLANT
GLOVE BIOGEL PI INDICATOR 7.0 (GLOVE) ×2
GLOVE SURG SYN 6.5 ES PF (GLOVE) ×6 IMPLANT
GOWN STRL REUS W/ TWL LRG LVL3 (GOWN DISPOSABLE) ×3 IMPLANT
GOWN STRL REUS W/TWL LRG LVL3 (GOWN DISPOSABLE) ×6
JACKSON PRATT 10 (INSTRUMENTS) IMPLANT
KIT TURNOVER KIT A (KITS) ×3 IMPLANT
LABEL OR SOLS (LABEL) ×3 IMPLANT
LIGHT WAVEGUIDE WIDE FLAT (MISCELLANEOUS) IMPLANT
NEEDLE HYPO 25X1 1.5 SAFETY (NEEDLE) ×3 IMPLANT
PACK BASIN MINOR ARMC (MISCELLANEOUS) ×3 IMPLANT
SUT MNCRL 4-0 (SUTURE) ×2
SUT MNCRL 4-0 27XMFL (SUTURE) ×1
SUT SILK 2 0 (SUTURE)
SUT SILK 2 0 SH (SUTURE) ×3 IMPLANT
SUT SILK 2-0 30XBRD TIE 12 (SUTURE) IMPLANT
SUT SILK 3 0 12 30 (SUTURE) IMPLANT
SUT VIC AB 3-0 SH 27 (SUTURE) ×2
SUT VIC AB 3-0 SH 27X BRD (SUTURE) ×1 IMPLANT
SUTURE MNCRL 4-0 27XMF (SUTURE) ×1 IMPLANT
SYR 10ML LL (SYRINGE) IMPLANT
SYR 30ML LL (SYRINGE) ×6 IMPLANT
SYR 50ML LL SCALE MARK (SYRINGE) IMPLANT
WATER STERILE IRR 1000ML POUR (IV SOLUTION) ×3 IMPLANT

## 2018-01-11 NOTE — Anesthesia Post-op Follow-up Note (Signed)
Anesthesia QCDR form completed.        

## 2018-01-11 NOTE — Discharge Instructions (Signed)
AMBULATORY SURGERY  DISCHARGE INSTRUCTIONS   1) The drugs that you were given will stay in your system until tomorrow so for the next 24 hours you should not:  A) Drive an automobile B) Make any legal decisions C) Drink any alcoholic beverage   2) You may resume regular meals tomorrow.  Today it is better to start with liquids and gradually work up to solid foods.  You may eat anything you prefer, but it is better to start with liquids, then soup and crackers, and gradually work up to solid foods.   3) Please notify your doctor immediately if you have any unusual bleeding, trouble breathing, redness and pain at the surgery site, drainage, fever, or pain not relieved by medication. 4)   5) Your post-operative visit with Dr.                                     is: Date:                        Time:    Please call to schedule your post-operative visit.  6) Additional Instructions:      Lumpectomy, Care After This sheet gives you information about how to care for yourself after your procedure. Your health care provider may also give you more specific instructions. If you have problems or questions, contact your health care provider. What can I expect after the procedure? After the procedure, it is common to have:  Breast swelling.  Breast tenderness.  Stiffness in your arm or shoulder.  A change in the shape and feel of your breast.  Scar tissue that feels hard to the touch in the area where the lump was removed.  Follow these instructions at home: - tylenol and advil as needed for discomfort.  Please alternate between the two every four hours as needed for pain.   - Use narcotics, if prescribed, only when tylenol and motrin is not enough to control pain. - 325-650mg  every 8hrs to max of 4000mg /24hrs (including the 325mg  in every norco dose) for the tylenol.   - Advil up to 800mg  per dose every 8hrs as needed for pain.    Bathing  Take sponge baths until your health  care provider says that you can start showering or bathing.  Do not take baths, swim, or use a hot tub until your health care provider approves. Incision care  Follow instructions from your health care provider about how to take care of your incision. Make sure you: ? Wash your hands with soap and water before you change your bandage (dressing). If soap and water are not available, use hand sanitizer. ? Change your dressing as told by your health care provider. ? Leave stitches (sutures), skin glue, or adhesive strips in place. These skin closures may need to stay in place for 2 weeks or longer. If adhesive strip edges start to loosen and curl up, you may trim the loose edges. Do not remove adhesive strips completely unless your health care provider tells you to do that.  Check your incision area every day for signs of infection. Check for: ? More redness, swelling, or pain. ? More fluid or blood. ? Warmth. ? Pus or a bad smell.  Keep your dressing clean and dry.  If you were sent home with a surgical drain in place, follow instructions from your health care provider  about emptying it. Activity  Return to your normal activities as told by your health care provider. Ask your health care provider what activities are safe for you.  Avoid activities that require a lot of energy (are strenuous).  Be careful to avoid any activities that could cause an injury to your arm on the side of your surgery.  Do not lift anything that is heavier than 10 lb (4.5 kg). Avoid lifting with the arm that is on the side of your surgery.  Do not carry heavy objects on your shoulder.  After your drain is removed, you should perform exercises to keep your arm from getting stiff and swollen. Talk with your health care provider about which exercises are safe for you. General instructions  Take over-the-counter and prescription medicines only as told by your health care provider.  You may eat what you usually  do.  Wear a supportive bra as told by your health care provider.  Raise (elevate) your arm above the level of your heart while you are sitting or lying down.  Do not wear tight jewelry on your arm, wrist, or fingers on the side of your surgery. Follow-up  Keep all follow-up visits as told by your health care provider. This is important.  You may need to be screened for extra fluid around the lymph nodes (lymphedema). Follow instructions from your health care provider about how often you should be checked.  If you had any lymph nodes removed during your procedure, be sure to tell all of your health care providers. This is important information to share before you are involved in certain procedures, such as giving blood or having your blood pressure taken. Contact a health care provider if:  You develop a rash.  You have a fever.  Your pain medicine is not working.  Your swelling, weakness, or numbness in your arm has not improved after a few weeks.  You have new swelling in your breast or arm.  You have more redness, swelling, or pain in your incision area.  You have more fluid or blood coming from your incision.  Your incision feels warm to the touch.  You have pus or a bad smell coming from your incision. Get help right away if:  You have very bad pain in your breast or arm.  You have chest pain.  You have difficulty breathing. This information is not intended to replace advice given to you by your health care provider. Make sure you discuss any questions you have with your health care provider. Document Released: 04/19/2006 Document Revised: 12/15/2015 Document Reviewed: 12/15/2015 Elsevier Interactive Patient Education  2018 [employer].

## 2018-01-11 NOTE — Anesthesia Preprocedure Evaluation (Addendum)
Anesthesia Evaluation  Patient identified by MRN, date of birth, ID band Patient awake    Reviewed: Allergy & Precautions, H&P , NPO status , Patient's Chart, lab work & pertinent test results  Airway Mallampati: III  TM Distance: >3 FB Neck ROM: full    Dental  (+) Teeth Intact   Pulmonary neg pulmonary ROS, former smoker,    breath sounds clear to auscultation       Cardiovascular negative cardio ROS   Rhythm:regular Rate:Normal     Neuro/Psych negative neurological ROS  negative psych ROS   GI/Hepatic negative GI ROS, Neg liver ROS,   Endo/Other  negative endocrine ROSHypothyroidism   Renal/GU      Musculoskeletal   Abdominal   Peds  Hematology negative hematology ROS (+)   Anesthesia Other Findings Past Medical History: 12/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 No date: Hashimoto's thyroiditis No date: Hypothyroidism No date: Vitamin D deficiency  Past Surgical History: 12/13/2017: BREAST BIOPSY; Left     Comment:  affirm stereo biopsy/HIGH-GRADE DUCTAL CARCINOMA IN               SITU, COMEDO TYPE  02/16/2005: BREAST CYST ASPIRATION; Left 01/11/2018: BREAST LUMPECTOMY; Left     Comment:  HIGH-GRADE DUCTAL CARCINOMA IN SITU, COMEDO TYPE  03/28/2006: CERVICAL POLYPECTOMY No date: COLONOSCOPY     Reproductive/Obstetrics negative OB ROS                           Anesthesia Physical Anesthesia Plan  ASA: II  Anesthesia Plan: General LMA   Post-op Pain Management:    Induction:   PONV Risk Score and Plan: Ondansetron, Dexamethasone and Scopolamine patch - Pre-op  Airway Management Planned:   Additional Equipment:   Intra-op Plan:   Post-operative Plan:   Informed Consent: I have reviewed the patients History and Physical, chart, labs and discussed the procedure including the risks, benefits and alternatives for the proposed anesthesia with the patient  or authorized representative who has indicated his/her understanding and acceptance.   Dental Advisory Given  Plan Discussed with: Anesthesiologist, CRNA and Surgeon  Anesthesia Plan Comments:       Anesthesia Quick Evaluation

## 2018-01-11 NOTE — Anesthesia Procedure Notes (Signed)
Procedure Name: LMA Insertion Date/Time: 01/11/2018 11:09 AM Performed by: Jonna Clark, CRNA Pre-anesthesia Checklist: Patient identified, Patient being monitored, Timeout performed, Emergency Drugs available and Suction available Patient Re-evaluated:Patient Re-evaluated prior to induction Oxygen Delivery Method: Circle system utilized Preoxygenation: Pre-oxygenation with 100% oxygen Induction Type: IV induction Ventilation: Mask ventilation without difficulty LMA: LMA inserted LMA Size: 4.0 Tube type: Oral Number of attempts: 1 Placement Confirmation: positive ETCO2 and breath sounds checked- equal and bilateral Tube secured with: Tape Dental Injury: Teeth and Oropharynx as per pre-operative assessment

## 2018-01-11 NOTE — Transfer of Care (Signed)
Immediate Anesthesia Transfer of Care Note  Patient: Victoria Hammond  Procedure(s) Performed: PARTIAL MASTECTOMY WITH NEEDLE LOCALIZATION (Left )  Patient Location: PACU  Anesthesia Type:General  Level of Consciousness: awake, alert  and oriented  Airway & Oxygen Therapy: Patient Spontanous Breathing and Patient connected to face mask oxygen  Post-op Assessment: Report given to RN and Post -op Vital signs reviewed and stable  Post vital signs: Reviewed and stable  Last Vitals:  Vitals Value Taken Time  BP 137/83 01/11/2018 12:42 PM  Temp    Pulse 89 01/11/2018 12:43 PM  Resp 17 01/11/2018 12:43 PM  SpO2 100 % 01/11/2018 12:43 PM  Vitals shown include unvalidated device data.  Last Pain:  Vitals:   01/11/18 0921  TempSrc: Tympanic         Complications: No apparent anesthesia complications

## 2018-01-11 NOTE — Interval H&P Note (Signed)
History and Physical Interval Note:  01/11/2018 10:17 AM  Victoria Hammond  has presented today for surgery, with the diagnosis of DUCTAL CARCINOMA IN SITU  The various methods of treatment have been discussed with the patient and family. After consideration of risks, benefits and other options for treatment, the patient has consented to  Procedure(s): PARTIAL MASTECTOMY WITH NEEDLE LOCALIZATION (Left) as a surgical intervention .  The patient's history has been reviewed, patient examined, no change in status, stable for surgery.  I have reviewed the patient's chart and labs.  Questions were answered to the patient's satisfaction.     Abdel Effinger Lysle Pearl

## 2018-01-11 NOTE — Op Note (Signed)
Preoperative diagnosis:  left breast DCIS.  Postoperative diagnosis: same.   Procedure: needle-localized left breast partial mastectomy.                      Anesthesia: GETA  Surgeon: Dr. Benjamine Sprague  Wound Classification: Clean  Indications: Patient is a 57 y.o. female with a nonpalpable left breast mass noted on mammography with core biopsy demonstrating  left breast DCIS. requires needle-localized lumpectomy for treatment   Specimen: left Breast mass, inferior-medial margin  Complications: None  Estimated Blood Loss: 14mL  Findings: 1. Specimen mammography shows marker and wire on specimen 2. Pathology call refers gross examination of margins was positive on inferior-medial aspect, additional margins taken. 3. No other palpable mass identified.   Description of procedure: Preoperative needle localization was performed by radiology. Localization studies were reviewed. The patient was taken to the operating room and placed supine on the operating table, and after general anesthesia the left breast and axilla were prepped and draped in the usual sterile fashion. A time-out was completed verifying correct patient, procedure, site, positioning, and implant(s) and/or special equipment prior to beginning this procedure.  By comparing the localization studies with the direction and skin entry site of the needle, the probable trajectory and location of the mass was visualized. Skin incision was planned in such a way as to minimize the amount of dissection to reach the mass.  The skin incision was made after infusion of local. Flaps were raised and the location of the wire confirmed. The wire was delivered into the wound. Sharp and blunt dissection was then taken down to the mass in breast tissue, measuring approx 1.5cm x cm x 2cm, taking care to include the entire localizing needle and a margin of grossly normal tissue. The specimen and entire localizing wire were removed. The specimen was  oriented with long lateral, short superior, deep double sutures and sent to radiology with the localization studies. Confirmation was received that marker was within specimen but previous biopsy site reached the medial, inferior margin, so additional tissue was taken at that site, labeled short superior, long old margin, double new margin and sent for pathology.  No clinically abnormal mass or tissue were palpated afterwards. Wound irrigated, hemostasis was achieved and the wound closed in layers with  interrupted sutures of 3-0 Vicryl in deep dermal layer and a running subcuticular suture of Monocryl 4-0, then dressed with dermabond. The patient tolerated the procedure well and was taken to the postanesthesia care unit in stable condition. Sponge and instrument count correct at end of procedure.

## 2018-01-14 LAB — SURGICAL PATHOLOGY

## 2018-01-15 NOTE — Anesthesia Postprocedure Evaluation (Signed)
Anesthesia Post Note  Patient: Victoria Hammond  Procedure(s) Performed: PARTIAL MASTECTOMY WITH NEEDLE LOCALIZATION (Left )  Patient location during evaluation: PACU Anesthesia Type: General Level of consciousness: awake and alert Pain management: pain level controlled Vital Signs Assessment: post-procedure vital signs reviewed and stable Respiratory status: spontaneous breathing, nonlabored ventilation and respiratory function stable Cardiovascular status: blood pressure returned to baseline and stable Postop Assessment: no apparent nausea or vomiting Anesthetic complications: no     Last Vitals:  Vitals:   01/11/18 1321 01/11/18 1345  BP: 137/64 123/65  Pulse: 73 73  Resp: 16 16  Temp: (!) 36.1 C   SpO2: 100% 100%    Last Pain:  Vitals:   01/14/18 1235  TempSrc:   PainSc: 0-No pain                 Durenda Hurt

## 2018-01-16 LAB — SURGICAL PATHOLOGY

## 2018-01-21 DIAGNOSIS — D0512 Intraductal carcinoma in situ of left breast: Secondary | ICD-10-CM | POA: Insufficient documentation

## 2018-01-22 ENCOUNTER — Encounter: Payer: Self-pay | Admitting: *Deleted

## 2018-01-23 ENCOUNTER — Encounter: Payer: Self-pay | Admitting: *Deleted

## 2018-01-24 ENCOUNTER — Other Ambulatory Visit: Payer: Self-pay

## 2018-01-24 NOTE — Progress Notes (Signed)
Opened in error

## 2018-01-31 ENCOUNTER — Ambulatory Visit: Payer: BLUE CROSS/BLUE SHIELD | Admitting: Oncology

## 2018-01-31 ENCOUNTER — Institutional Professional Consult (permissible substitution): Payer: BLUE CROSS/BLUE SHIELD | Admitting: Radiation Oncology

## 2018-02-04 ENCOUNTER — Encounter (INDEPENDENT_AMBULATORY_CARE_PROVIDER_SITE_OTHER): Payer: Self-pay

## 2018-02-04 ENCOUNTER — Inpatient Hospital Stay: Payer: BLUE CROSS/BLUE SHIELD | Attending: Oncology | Admitting: Oncology

## 2018-02-04 ENCOUNTER — Ambulatory Visit: Payer: BLUE CROSS/BLUE SHIELD | Admitting: Radiation Oncology

## 2018-02-04 ENCOUNTER — Encounter: Payer: Self-pay | Admitting: Oncology

## 2018-02-04 ENCOUNTER — Other Ambulatory Visit: Payer: Self-pay

## 2018-02-04 VITALS — BP 132/81 | HR 76 | Temp 97.6°F | Resp 16 | Ht 65.0 in | Wt 170.1 lb

## 2018-02-04 DIAGNOSIS — Z8041 Family history of malignant neoplasm of ovary: Secondary | ICD-10-CM

## 2018-02-04 DIAGNOSIS — D0512 Intraductal carcinoma in situ of left breast: Secondary | ICD-10-CM | POA: Diagnosis not present

## 2018-02-04 DIAGNOSIS — Z809 Family history of malignant neoplasm, unspecified: Secondary | ICD-10-CM

## 2018-02-04 DIAGNOSIS — Z780 Asymptomatic menopausal state: Secondary | ICD-10-CM

## 2018-02-04 NOTE — Progress Notes (Signed)
Patient here for follow up. She has had a lumpectomy since her last visit.

## 2018-02-06 NOTE — Progress Notes (Signed)
Hematology/Oncology Consult note [facility] [phone] [phone]   Patient Care Team: Rusty Aus, MD as PCP - General (Internal Medicine) Surgeon: Dr.Sakai  REASON FOR VISIT Follow up for treatment of high grade DCIS  HISTORY OF PRESENTING ILLNESS:  Victoria Hammond is a  57 y.o.  female with PMH listed below who was referred to me for evaluation of  Patient had screening mammogram on 11/22/2017 which showed suspicious calcifications.  She underwent digital diagnostic mammogram on 12/05/2017 which confirmed grouped fine pleomorphic calcifications within the upper outer quadrant of the left breast.  Spanning 8 mm.  Patient underwent stereotactic biopsy of left breast calcification. Biopsy pathology showed: High-grade ductal carcinoma in situ, comedo type with associated calcifications.  Nipple discharge: Denies Family history: Maternal grandmother deceased from lung and the brain cancer, maternal uncle deceased from lung cancer.  Maternal uncle deceased from esophageal cancer.  Another maternal uncle deceased from liver cancer.  Negative for breast cancer family history.  Patient declined genetic counseling  OCP use: used OCP between age of [age].  Estrogen and progesterone therapy: denies History of radiation to chest: denies.  Previous breast surgery: History of breast cyst aspiration.  She was accompanied by her daughter.   INTERVAL HISTORY Victoria Hammond is a 57 y.o. female who has above history reviewed by me today presents for follow up visit for management of high-grade DCIS. During the interval, patient had underwent lumpectomy by Dr. Lysle Pearl.  01/11/2018 left breast lumpectomy showed high-grade DCIS, comedo type with associated calcifications. Size of DCIS at least 4 mm, grade 3, comedo necrosis, margins not involved by DCIS, distance from closest margin 8 mm. pTis pNx  Patient presents to discuss about pathology results and  subsequent management plan.  Reports wound healing well.  No issue.  Review of Systems  Constitutional: Negative for chills, fever, malaise/fatigue and weight loss.  HENT: Negative for nosebleeds and sore throat.   Eyes: Negative for double vision, photophobia and redness.  Respiratory: Negative for cough, shortness of breath and wheezing.   Cardiovascular: Negative for chest pain, palpitations and orthopnea.  Gastrointestinal: Negative for abdominal pain, blood in stool, nausea and vomiting.  Genitourinary: Negative for dysuria.  Musculoskeletal: Negative for back pain, myalgias and neck pain.  Skin: Negative for itching and rash.  Neurological: Negative for dizziness, tingling and tremors.  Endo/Heme/Allergies: Negative for environmental allergies. Does not bruise/bleed easily.  Psychiatric/Behavioral: Negative for depression. The patient is nervous/anxious.     MEDICAL HISTORY:  Past Medical History:  Diagnosis Date  . 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 Hashimoto's thyroiditis   . Hypothyroidism   . Vitamin D deficiency     SURGICAL HISTORY: Past Surgical History:  Procedure Laterality Date  . BREAST BIOPSY Left 12/13/2017   affirm stereo biopsy/HIGH-GRADE DUCTAL CARCINOMA IN SITU, COMEDO TYPE   . BREAST CYST ASPIRATION Left 02/16/2005  . BREAST LUMPECTOMY Left 01/11/2018   HIGH-GRADE DUCTAL CARCINOMA IN SITU, COMEDO TYPE   . CERVICAL POLYPECTOMY  03/28/2006  . COLONOSCOPY    . PARTIAL MASTECTOMY WITH NEEDLE LOCALIZATION Left 01/11/2018   Procedure: PARTIAL MASTECTOMY WITH NEEDLE LOCALIZATION;  Surgeon: Benjamine Sprague, DO;  Location: [facility] ORS;  Service: General;  Laterality: Left;    SOCIAL HISTORY: Social History   Socioeconomic History  . Marital status: Married    Spouse name: Not on file  . Number of children: Not on file  . Years of education: Not on file  .  Highest education level: Not on file  Occupational History  . Not on file  Social  Needs  . Financial resource strain: Not on file  . Food insecurity:    Worry: Not on file    Inability: Not on file  . Transportation needs:    Medical: Not on file    Non-medical: Not on file  Tobacco Use  . Smoking status: Former Smoker    Packs/day: 1.00    Years: 8.00    Pack years: 8.00    Last attempt to quit: 01/03/1988    Years since quitting: 30.1  . Smokeless tobacco: Never Used  Substance and Sexual Activity  . Alcohol use: Never    Frequency: Never  . Drug use: Never  . Sexual activity: Not on file  Lifestyle  . Physical activity:    Days per week: Not on file    Minutes per session: Not on file  . Stress: Not on file  Relationships  . Social connections:    Talks on phone: Not on file    Gets together: Not on file    Attends religious service: Not on file    Active member of club or organization: Not on file    Attends meetings of clubs or organizations: Not on file    Relationship status: Not on file  . Intimate partner violence:    Fear of current or ex partner: Not on file    Emotionally abused: Not on file    Physically abused: Not on file    Forced sexual activity: Not on file  Other Topics Concern  . Not on file  Social History Narrative  . Not on file    FAMILY HISTORY: Family History  Problem Relation Age of Onset  . Hypertension Mother   . Congestive Heart Failure Father   . Lung cancer Maternal Uncle   . Ovarian cancer Maternal Grandmother        ovarian cancer mets to lung and brain  . Esophageal cancer Maternal Uncle   . Liver cancer Maternal Uncle   . Breast cancer Neg Hx     ALLERGIES:  is allergic to sulfa antibiotics.  MEDICATIONS:  Current Outpatient Medications  Medication Sig Dispense Refill  . acetaminophen (TYLENOL) 325 MG tablet Take 2 tablets (650 mg total) by mouth every 8 (eight) hours as needed for mild pain. 40 tablet 0  . ALPRAZolam (XANAX) 0.5 MG tablet Take 0.25 mg by mouth at bedtime as needed for sleep.     .  Cholecalciferol (VITAMIN D3) 2000 units capsule Take 2,000 Units by mouth every evening.     . etodolac (LODINE) 400 MG tablet Take 400 mg by mouth every evening.   3  . levothyroxine (SYNTHROID, LEVOTHROID) 75 MCG tablet Take 75 mcg by mouth daily before breakfast.     . vitamin B-12 (CYANOCOBALAMIN) 1000 MCG tablet Take 1,000 mcg by mouth every evening.      No current facility-administered medications for this visit.      PHYSICAL EXAMINATION: ECOG PERFORMANCE STATUS: 0 - Asymptomatic Vitals:   02/04/18 1437 02/04/18 1441  BP:  132/81  Pulse:  76  Resp: 16   Temp:  97.6 F (36.4 C)   Filed Weights   02/04/18 1437  Weight: 170 lb 1.6 oz (77.2 kg)    Physical Exam  Constitutional: She is oriented to person, place, and time. No distress.  HENT:  Head: Normocephalic and atraumatic.  Mouth/Throat: Oropharynx is clear and moist.  Eyes: Pupils are equal, round, and reactive to light. EOM are normal. No scleral icterus.  Neck: Normal range of motion. Neck supple.  Cardiovascular: Normal rate, regular rhythm and normal heart sounds.  Pulmonary/Chest: Effort normal. No respiratory distress. She has no wheezes.  Abdominal: Soft. Bowel sounds are normal. She exhibits no distension and no mass. There is no tenderness.  Musculoskeletal: Normal range of motion. She exhibits no edema or deformity.  Neurological: She is alert and oriented to person, place, and time. No cranial nerve deficit. Coordination normal.  Skin: Skin is warm and dry. No rash noted. No erythema.  Psychiatric: She has a normal mood and affect. Her behavior is normal. Thought content normal.  Breast exam was performed in seated and lying down position. Status post left lumpectomy.      LABORATORY DATA:  I have reviewed the data as listed No results found for: WBC, HGB, HCT, MCV, PLT No results for input(s): NA, K, CL, CO2, GLUCOSE, BUN, CREATININE, CALCIUM, GFRNONAA, GFRAA, PROT, ALBUMIN, AST, ALT, ALKPHOS,  BILITOT, BILIDIR, IBILI in the last 8760 hours. Iron/TIBC/Ferritin/ %Sat No results found for: IRON, TIBC, FERRITIN, IRONPCTSAT   I have reviewed patient's recent labs done at [facility] clinic.  09/06/2017 Cbc hemoglobin 14.8, hematocrit 43.7, platelet counts 227, WBC 5.7.  Normal differential. Normal creatinine and bilirubin level.  ASSESSMENT & PLAN:  1. Ductal carcinoma in situ (DCIS) of left breast   2. Family history of cancer   Pathology was reviewed in details and discussed with patient. The diagnosis and care plan were discussed with patient in detail.  NCCN guidelines were reviewed and shared with patient.   High-grade DCIS status post lumpectomy with satisfactory negative margin  recommend adjuvant radiation followed by adjuvant anti-estrogen treatments with tamoxifen or anastrozole.  She is postmenopausal.  Reports last menstrual period 2 to 3 years ago. We discussed about rationale and side effects of tamoxifen and anastrozole.  Family history of cancer, including ovary cancer.  Patient declined genetic counseling. Refer to radiation oncology for evaluation of adjuvant radiation.  Patient tells me that she is not excited about radiation We spent sufficient time to discuss many aspect of care, questions were answered to patient's satisfaction.   Return of visit: Tentative schedule patient to come back in 8 weeks..  Patient is to call clinic if she decides not to proceed with radiation and will see her earlier for starting antiestrogen treatments. Total face to face encounter time for this patient visit was 52min. >50% of the time was  spent in counseling and coordination of care.     Earlie Server, MD, PhD Hematology Oncology [facility] Pager- [phone] 02/06/2018

## 2018-02-11 ENCOUNTER — Ambulatory Visit
Admission: RE | Admit: 2018-02-11 | Discharge: 2018-02-11 | Disposition: A | Discharge status: Home / Self Care | Payer: BLUE CROSS/BLUE SHIELD | Source: Ambulatory Visit | Attending: Radiation Oncology | Admitting: Radiation Oncology

## 2018-02-11 ENCOUNTER — Encounter: Payer: Self-pay | Admitting: Radiation Oncology

## 2018-02-11 ENCOUNTER — Other Ambulatory Visit: Payer: Self-pay

## 2018-02-11 DIAGNOSIS — D0512 Intraductal carcinoma in situ of left breast: Secondary | ICD-10-CM | POA: Insufficient documentation

## 2018-02-11 DIAGNOSIS — Z801 Family history of malignant neoplasm of trachea, bronchus and lung: Secondary | ICD-10-CM | POA: Insufficient documentation

## 2018-02-11 DIAGNOSIS — E039 Hypothyroidism, unspecified: Secondary | ICD-10-CM | POA: Diagnosis not present

## 2018-02-11 DIAGNOSIS — Z79899 Other long term (current) drug therapy: Secondary | ICD-10-CM | POA: Diagnosis not present

## 2018-02-11 DIAGNOSIS — E559 Vitamin D deficiency, unspecified: Secondary | ICD-10-CM | POA: Diagnosis not present

## 2018-02-11 DIAGNOSIS — E063 Autoimmune thyroiditis: Secondary | ICD-10-CM | POA: Insufficient documentation

## 2018-02-11 DIAGNOSIS — Z87891 Personal history of nicotine dependence: Secondary | ICD-10-CM | POA: Insufficient documentation

## 2018-02-11 DIAGNOSIS — Z170 Estrogen receptor positive status [ER+]: Secondary | ICD-10-CM | POA: Insufficient documentation

## 2018-02-11 NOTE — Consult Note (Signed)
NEW PATIENT EVALUATION  Name: Victoria Hammond  MRN: [id number]  Date:   02/11/2018     DOB: 06-16-1960   This 57 y.o. female patient presents to the clinic for initial evaluation of ER PR positive ductal carcinoma in situ high-grade comedo type in the upper outer quadrant of the left breast status post wide local excision.  REFERRING PHYSICIAN: Rusty Aus, MD  CHIEF COMPLAINT:  Chief Complaint  Patient presents with  . Breast Cancer    Initial eval    DIAGNOSIS: The encounter diagnosis was Ductal carcinoma in situ (DCIS) of left breast.   PREVIOUS INVESTIGATIONS:  Mammogram and ultrasound reviewed Clinical notes reviewed Pathology report reviewed  HPI: Patient is a 57 year old female who presented with an abnormal mammogram of her left breast in the upper outer quadrant showing fine pleomorphic calcifications which prompted stereotactic biopsy.  Pathology showed high-grade ductal carcinoma in situ comedo type with associated calcifications.  She then underwent wide local excision showing 4 mm area of high-grade ductal carcinoma in situ with margins clear at 8 mm.  No lymph nodes were submitted.  She is tolerated her surgery well and is without complaint today.  She is been seen by medical oncology with recommendation for antiestrogen therapy after completion of radiation.  She specifically denies breast tenderness cough or bone pain.  PLANNED TREATMENT REGIMEN: Left whole breast radiation  PAST MEDICAL HISTORY:  has a past medical history of Ductal carcinoma in situ (DCIS) of left breast (12/2017), Hashimoto's thyroiditis, Hypothyroidism, and Vitamin D deficiency.    PAST SURGICAL HISTORY:  Past Surgical History:  Procedure Laterality Date  . BREAST BIOPSY Left 12/13/2017   affirm stereo biopsy/HIGH-GRADE DUCTAL CARCINOMA IN SITU, COMEDO TYPE   . BREAST CYST ASPIRATION Left 02/16/2005  . BREAST LUMPECTOMY Left 01/11/2018   HIGH-GRADE DUCTAL CARCINOMA IN SITU, COMEDO TYPE    . CERVICAL POLYPECTOMY  03/28/2006  . COLONOSCOPY    . PARTIAL MASTECTOMY WITH NEEDLE LOCALIZATION Left 01/11/2018   Procedure: PARTIAL MASTECTOMY WITH NEEDLE LOCALIZATION;  Surgeon: Benjamine Sprague, DO;  Location: [facility] ORS;  Service: General;  Laterality: Left;    FAMILY HISTORY: family history includes Congestive Heart Failure in her father; Esophageal cancer in her maternal uncle; Hypertension in her mother; Liver cancer in her maternal uncle; Lung cancer in her maternal uncle; Ovarian cancer in her maternal grandmother.  SOCIAL HISTORY:  reports that she quit smoking about 30 years ago. She has a 8.00 pack-year smoking history. She has never used smokeless tobacco. She reports that she does not drink alcohol or use drugs.  ALLERGIES: Sulfa antibiotics  MEDICATIONS:  Current Outpatient Medications  Medication Sig Dispense Refill  . ALPRAZolam (XANAX) 0.5 MG tablet Take 0.25 mg by mouth at bedtime as needed for sleep.     . Cholecalciferol (VITAMIN D3) 2000 units capsule Take 2,000 Units by mouth every evening.     . etodolac (LODINE) 400 MG tablet Take 400 mg by mouth every evening.   3  . levothyroxine (SYNTHROID, LEVOTHROID) 75 MCG tablet Take 75 mcg by mouth daily before breakfast.     . vitamin B-12 (CYANOCOBALAMIN) 1000 MCG tablet Take 1,000 mcg by mouth every evening.      No current facility-administered medications for this encounter.     ECOG PERFORMANCE STATUS:  0 - Asymptomatic  REVIEW OF SYSTEMS: Patient denies any weight loss, fatigue, weakness, fever, chills or night sweats. Patient denies any loss of vision, blurred vision. Patient denies any ringing  of  the ears or hearing loss. No irregular heartbeat. Patient denies heart murmur or history of fainting. Patient denies any chest pain or pain radiating to her upper extremities. Patient denies any shortness of breath, difficulty breathing at night, cough or hemoptysis. Patient denies any swelling in the lower legs. Patient  denies any nausea vomiting, vomiting of blood, or coffee ground material in the vomitus. Patient denies any stomach pain. Patient states has had normal bowel movements no significant constipation or diarrhea. Patient denies any dysuria, hematuria or significant nocturia. Patient denies any problems walking, swelling in the joints or loss of balance. Patient denies any skin changes, loss of hair or loss of weight. Patient denies any excessive worrying or anxiety or significant depression. Patient denies any problems with insomnia. Patient denies excessive thirst, polyuria, polydipsia. Patient denies any swollen glands, patient denies easy bruising or easy bleeding. Patient denies any recent infections, allergies or URI. Patient "s visual fields have not changed significantly in recent time.   PHYSICAL EXAM: BP (P) 133/79 (BP Location: Left Arm, Patient Position: Sitting)   Pulse (P) 81   Temp (!) (P) 95.4 F (35.2 C) (Tympanic)   Wt (P) 169 lb 5 oz (76.8 kg)   BMI (P) 28.18 kg/m  Left breast is wide local excision site which is well-healed no dominant mass or nodularity is noted in either breast in 2 positions examined.  No axillary or supraclavicular adenopathy is identified.  Well-developed well-nourished patient in NAD. HEENT reveals PERLA, EOMI, discs not visualized.  Oral cavity is clear. No oral mucosal lesions are identified. Neck is clear without evidence of cervical or supraclavicular adenopathy. Lungs are clear to A&P. Cardiac examination is essentially unremarkable with regular rate and rhythm without murmur rub or thrill. Abdomen is benign with no organomegaly or masses noted. Motor sensory and DTR levels are equal and symmetric in the upper and lower extremities. Cranial nerves II through XII are grossly intact. Proprioception is intact. No peripheral adenopathy or edema is identified. No motor or sensory levels are noted. Crude visual fields are within normal range.  LABORATORY DATA:  Pathology report reviewed    RADIOLOGY RESULTS: Mammogram and ultrasound reviewed   IMPRESSION: Stage 0 ER PR positive ductal carcinoma in situ of the left breast status post wide local excision in 57 year old female  PLAN: At this time her breasts are rather large and pendulous making hypofractionated course of treatment difficult.  We will plan on delivering 5040 cGy in 28 fractions boosting her scar another 1400 cGy using electron-beam.  Risks and benefits of treatment including skin reaction fatigue alteration of blood counts possible occlusion of superficial lung or were described in detail to the patient.  She seems to comprehend my treatment plan well.  I have personally set up and ordered CT simulation for later this week.  Patient also will be a candidate for antiestrogen therapy after completion of radiation.  I would like to take this opportunity to thank you for allowing me to participate in the care of your patient.Noreene Filbert, MD

## 2018-02-12 ENCOUNTER — Encounter: Payer: Self-pay | Admitting: *Deleted

## 2018-02-12 NOTE — Progress Notes (Signed)
  Oncology Nurse Navigator Documentation  Navigator Location: [facility] (02/12/18 1000)   )Navigator Encounter Type: Telephone (02/12/18 1000)                       Treatment Phase: Active Tx (02/12/18 1000)                            Time Spent with Patient: 15 (02/12/18 1000)   Called patient to follow up to see if she has any needs.  She is to start radiation therapy and then follow up with Dr. Tasia Catchings in December.  No needs at this time.

## 2018-02-13 ENCOUNTER — Ambulatory Visit
Admission: RE | Admit: 2018-02-13 | Discharge: 2018-02-13 | Disposition: A | Discharge status: Home / Self Care | Payer: BLUE CROSS/BLUE SHIELD | Source: Ambulatory Visit | Attending: Radiation Oncology | Admitting: Radiation Oncology

## 2018-02-13 DIAGNOSIS — D0512 Intraductal carcinoma in situ of left breast: Secondary | ICD-10-CM | POA: Diagnosis not present

## 2018-02-14 DIAGNOSIS — D0512 Intraductal carcinoma in situ of left breast: Secondary | ICD-10-CM | POA: Diagnosis not present

## 2018-02-15 ENCOUNTER — Other Ambulatory Visit: Payer: Self-pay | Admitting: *Deleted

## 2018-02-15 DIAGNOSIS — D0512 Intraductal carcinoma in situ of left breast: Secondary | ICD-10-CM

## 2018-02-20 ENCOUNTER — Ambulatory Visit
Admission: RE | Admit: 2018-02-20 | Discharge: 2018-02-20 | Disposition: A | Discharge status: Home / Self Care | Payer: BLUE CROSS/BLUE SHIELD | Source: Ambulatory Visit | Attending: Radiation Oncology | Admitting: Radiation Oncology

## 2018-02-20 DIAGNOSIS — D0512 Intraductal carcinoma in situ of left breast: Secondary | ICD-10-CM | POA: Diagnosis not present

## 2018-02-21 ENCOUNTER — Ambulatory Visit
Admission: RE | Admit: 2018-02-21 | Discharge: 2018-02-21 | Disposition: A | Discharge status: Home / Self Care | Payer: BLUE CROSS/BLUE SHIELD | Source: Ambulatory Visit | Attending: Radiation Oncology | Admitting: Radiation Oncology

## 2018-02-21 DIAGNOSIS — D0512 Intraductal carcinoma in situ of left breast: Secondary | ICD-10-CM | POA: Diagnosis not present

## 2018-02-22 ENCOUNTER — Ambulatory Visit
Admission: RE | Admit: 2018-02-22 | Discharge: 2018-02-22 | Disposition: A | Discharge status: Home / Self Care | Payer: BLUE CROSS/BLUE SHIELD | Source: Ambulatory Visit | Attending: Radiation Oncology | Admitting: Radiation Oncology

## 2018-02-22 DIAGNOSIS — D0512 Intraductal carcinoma in situ of left breast: Secondary | ICD-10-CM | POA: Diagnosis not present

## 2018-02-25 ENCOUNTER — Ambulatory Visit
Admission: RE | Admit: 2018-02-25 | Discharge: 2018-02-25 | Disposition: A | Discharge status: Home / Self Care | Payer: BLUE CROSS/BLUE SHIELD | Source: Ambulatory Visit | Attending: Radiation Oncology | Admitting: Radiation Oncology

## 2018-02-25 DIAGNOSIS — D0512 Intraductal carcinoma in situ of left breast: Secondary | ICD-10-CM | POA: Diagnosis not present

## 2018-02-26 ENCOUNTER — Ambulatory Visit
Admission: RE | Admit: 2018-02-26 | Discharge: 2018-02-26 | Disposition: A | Discharge status: Home / Self Care | Payer: BLUE CROSS/BLUE SHIELD | Source: Ambulatory Visit | Attending: Radiation Oncology | Admitting: Radiation Oncology

## 2018-02-26 DIAGNOSIS — D0512 Intraductal carcinoma in situ of left breast: Secondary | ICD-10-CM | POA: Diagnosis not present

## 2018-02-27 ENCOUNTER — Ambulatory Visit
Admission: RE | Admit: 2018-02-27 | Discharge: 2018-02-27 | Disposition: A | Discharge status: Home / Self Care | Payer: BLUE CROSS/BLUE SHIELD | Source: Ambulatory Visit | Attending: Radiation Oncology | Admitting: Radiation Oncology

## 2018-02-27 DIAGNOSIS — D0512 Intraductal carcinoma in situ of left breast: Secondary | ICD-10-CM | POA: Diagnosis not present

## 2018-02-28 ENCOUNTER — Ambulatory Visit
Admission: RE | Admit: 2018-02-28 | Discharge: 2018-02-28 | Disposition: A | Discharge status: Home / Self Care | Payer: BLUE CROSS/BLUE SHIELD | Source: Ambulatory Visit | Attending: Radiation Oncology | Admitting: Radiation Oncology

## 2018-02-28 DIAGNOSIS — D0512 Intraductal carcinoma in situ of left breast: Secondary | ICD-10-CM | POA: Diagnosis not present

## 2018-03-01 ENCOUNTER — Ambulatory Visit
Admission: RE | Admit: 2018-03-01 | Discharge: 2018-03-01 | Disposition: A | Discharge status: Home / Self Care | Payer: BLUE CROSS/BLUE SHIELD | Source: Ambulatory Visit | Attending: Radiation Oncology | Admitting: Radiation Oncology

## 2018-03-01 DIAGNOSIS — D0512 Intraductal carcinoma in situ of left breast: Secondary | ICD-10-CM | POA: Diagnosis not present

## 2018-03-04 ENCOUNTER — Ambulatory Visit
Admission: RE | Admit: 2018-03-04 | Discharge: 2018-03-04 | Disposition: A | Discharge status: Home / Self Care | Payer: BLUE CROSS/BLUE SHIELD | Source: Ambulatory Visit | Attending: Radiation Oncology | Admitting: Radiation Oncology

## 2018-03-04 DIAGNOSIS — D0512 Intraductal carcinoma in situ of left breast: Secondary | ICD-10-CM | POA: Diagnosis not present

## 2018-03-05 ENCOUNTER — Ambulatory Visit
Admission: RE | Admit: 2018-03-05 | Discharge: 2018-03-05 | Disposition: A | Discharge status: Home / Self Care | Payer: BLUE CROSS/BLUE SHIELD | Source: Ambulatory Visit | Attending: Radiation Oncology | Admitting: Radiation Oncology

## 2018-03-05 ENCOUNTER — Inpatient Hospital Stay: Payer: BLUE CROSS/BLUE SHIELD | Attending: Oncology

## 2018-03-05 DIAGNOSIS — D0512 Intraductal carcinoma in situ of left breast: Secondary | ICD-10-CM | POA: Diagnosis present

## 2018-03-05 LAB — CBC
HEMATOCRIT: 44.1 % (ref 36.0–46.0)
HEMOGLOBIN: 14.7 g/dL (ref 12.0–15.0)
MCH: 28.8 pg (ref 26.0–34.0)
MCHC: 33.3 g/dL (ref 30.0–36.0)
MCV: 86.3 fL (ref 80.0–100.0)
NRBC: 0 % (ref 0.0–0.2)
Platelets: 224 10*3/uL (ref 150–400)
RBC: 5.11 MIL/uL (ref 3.87–5.11)
RDW: 12.1 % (ref 11.5–15.5)
WBC: 5.1 10*3/uL (ref 4.0–10.5)

## 2018-03-06 ENCOUNTER — Ambulatory Visit
Admission: RE | Admit: 2018-03-06 | Discharge: 2018-03-06 | Disposition: A | Discharge status: Home / Self Care | Payer: BLUE CROSS/BLUE SHIELD | Source: Ambulatory Visit | Attending: Radiation Oncology | Admitting: Radiation Oncology

## 2018-03-06 DIAGNOSIS — D0512 Intraductal carcinoma in situ of left breast: Secondary | ICD-10-CM | POA: Diagnosis not present

## 2018-03-07 ENCOUNTER — Ambulatory Visit
Admission: RE | Admit: 2018-03-07 | Discharge: 2018-03-07 | Disposition: A | Discharge status: Home / Self Care | Payer: BLUE CROSS/BLUE SHIELD | Source: Ambulatory Visit | Attending: Radiation Oncology | Admitting: Radiation Oncology

## 2018-03-07 DIAGNOSIS — D0512 Intraductal carcinoma in situ of left breast: Secondary | ICD-10-CM | POA: Diagnosis not present

## 2018-03-08 ENCOUNTER — Ambulatory Visit
Admission: RE | Admit: 2018-03-08 | Discharge: 2018-03-08 | Disposition: A | Discharge status: Home / Self Care | Payer: BLUE CROSS/BLUE SHIELD | Source: Ambulatory Visit | Attending: Radiation Oncology | Admitting: Radiation Oncology

## 2018-03-08 DIAGNOSIS — D0512 Intraductal carcinoma in situ of left breast: Secondary | ICD-10-CM | POA: Diagnosis not present

## 2018-03-11 ENCOUNTER — Ambulatory Visit
Admission: RE | Admit: 2018-03-11 | Discharge: 2018-03-11 | Disposition: A | Discharge status: Home / Self Care | Payer: BLUE CROSS/BLUE SHIELD | Source: Ambulatory Visit | Attending: Radiation Oncology | Admitting: Radiation Oncology

## 2018-03-11 DIAGNOSIS — D0512 Intraductal carcinoma in situ of left breast: Secondary | ICD-10-CM | POA: Diagnosis not present

## 2018-03-12 ENCOUNTER — Ambulatory Visit
Admission: RE | Admit: 2018-03-12 | Discharge: 2018-03-12 | Disposition: A | Discharge status: Home / Self Care | Payer: BLUE CROSS/BLUE SHIELD | Source: Ambulatory Visit | Attending: Radiation Oncology | Admitting: Radiation Oncology

## 2018-03-12 DIAGNOSIS — D0512 Intraductal carcinoma in situ of left breast: Secondary | ICD-10-CM | POA: Diagnosis not present

## 2018-03-13 ENCOUNTER — Ambulatory Visit
Admission: RE | Admit: 2018-03-13 | Discharge: 2018-03-13 | Disposition: A | Discharge status: Home / Self Care | Payer: BLUE CROSS/BLUE SHIELD | Source: Ambulatory Visit | Attending: Radiation Oncology | Admitting: Radiation Oncology

## 2018-03-13 DIAGNOSIS — D0512 Intraductal carcinoma in situ of left breast: Secondary | ICD-10-CM | POA: Diagnosis not present

## 2018-03-18 ENCOUNTER — Ambulatory Visit
Admission: RE | Admit: 2018-03-18 | Discharge: 2018-03-18 | Disposition: A | Discharge status: Home / Self Care | Payer: BLUE CROSS/BLUE SHIELD | Source: Ambulatory Visit | Attending: Radiation Oncology | Admitting: Radiation Oncology

## 2018-03-18 DIAGNOSIS — Z79899 Other long term (current) drug therapy: Secondary | ICD-10-CM | POA: Diagnosis not present

## 2018-03-18 DIAGNOSIS — Z8041 Family history of malignant neoplasm of ovary: Secondary | ICD-10-CM | POA: Diagnosis not present

## 2018-03-18 DIAGNOSIS — Z800 Family history of malignant neoplasm of digestive organs: Secondary | ICD-10-CM | POA: Diagnosis not present

## 2018-03-18 DIAGNOSIS — Z801 Family history of malignant neoplasm of trachea, bronchus and lung: Secondary | ICD-10-CM | POA: Diagnosis not present

## 2018-03-18 DIAGNOSIS — D0512 Intraductal carcinoma in situ of left breast: Secondary | ICD-10-CM

## 2018-03-18 DIAGNOSIS — Z87891 Personal history of nicotine dependence: Secondary | ICD-10-CM | POA: Diagnosis not present

## 2018-03-18 DIAGNOSIS — Z510 Encounter for antineoplastic radiation therapy: Secondary | ICD-10-CM | POA: Diagnosis not present

## 2018-03-18 DIAGNOSIS — Z170 Estrogen receptor positive status [ER+]: Secondary | ICD-10-CM | POA: Diagnosis not present

## 2018-03-19 ENCOUNTER — Inpatient Hospital Stay: Payer: BLUE CROSS/BLUE SHIELD | Attending: Oncology

## 2018-03-19 ENCOUNTER — Ambulatory Visit
Admission: RE | Admit: 2018-03-19 | Discharge: 2018-03-19 | Disposition: A | Discharge status: Home / Self Care | Payer: BLUE CROSS/BLUE SHIELD | Source: Ambulatory Visit | Attending: Radiation Oncology | Admitting: Radiation Oncology

## 2018-03-19 ENCOUNTER — Other Ambulatory Visit: Payer: Self-pay

## 2018-03-19 DIAGNOSIS — Z800 Family history of malignant neoplasm of digestive organs: Secondary | ICD-10-CM | POA: Insufficient documentation

## 2018-03-19 DIAGNOSIS — D0512 Intraductal carcinoma in situ of left breast: Secondary | ICD-10-CM

## 2018-03-19 DIAGNOSIS — Z8041 Family history of malignant neoplasm of ovary: Secondary | ICD-10-CM | POA: Insufficient documentation

## 2018-03-19 DIAGNOSIS — Z79899 Other long term (current) drug therapy: Secondary | ICD-10-CM | POA: Insufficient documentation

## 2018-03-19 DIAGNOSIS — Z510 Encounter for antineoplastic radiation therapy: Secondary | ICD-10-CM | POA: Diagnosis not present

## 2018-03-19 DIAGNOSIS — Z801 Family history of malignant neoplasm of trachea, bronchus and lung: Secondary | ICD-10-CM | POA: Insufficient documentation

## 2018-03-19 DIAGNOSIS — Z170 Estrogen receptor positive status [ER+]: Secondary | ICD-10-CM | POA: Insufficient documentation

## 2018-03-19 DIAGNOSIS — Z87891 Personal history of nicotine dependence: Secondary | ICD-10-CM | POA: Insufficient documentation

## 2018-03-19 LAB — CBC
HCT: 42.6 % (ref 36.0–46.0)
HEMOGLOBIN: 14.1 g/dL (ref 12.0–15.0)
MCH: 28.3 pg (ref 26.0–34.0)
MCHC: 33.1 g/dL (ref 30.0–36.0)
MCV: 85.5 fL (ref 80.0–100.0)
Platelets: 209 10*3/uL (ref 150–400)
RBC: 4.98 MIL/uL (ref 3.87–5.11)
RDW: 12.4 % (ref 11.5–15.5)
WBC: 5.2 10*3/uL (ref 4.0–10.5)
nRBC: 0 % (ref 0.0–0.2)

## 2018-03-20 ENCOUNTER — Ambulatory Visit
Admission: RE | Admit: 2018-03-20 | Discharge: 2018-03-20 | Disposition: A | Discharge status: Home / Self Care | Payer: BLUE CROSS/BLUE SHIELD | Source: Ambulatory Visit | Attending: Radiation Oncology | Admitting: Radiation Oncology

## 2018-03-20 DIAGNOSIS — Z510 Encounter for antineoplastic radiation therapy: Secondary | ICD-10-CM | POA: Diagnosis not present

## 2018-03-21 ENCOUNTER — Ambulatory Visit
Admission: RE | Admit: 2018-03-21 | Discharge: 2018-03-21 | Disposition: A | Discharge status: Home / Self Care | Payer: BLUE CROSS/BLUE SHIELD | Source: Ambulatory Visit | Attending: Radiation Oncology | Admitting: Radiation Oncology

## 2018-03-21 DIAGNOSIS — Z510 Encounter for antineoplastic radiation therapy: Secondary | ICD-10-CM | POA: Diagnosis not present

## 2018-03-22 ENCOUNTER — Ambulatory Visit
Admission: RE | Admit: 2018-03-22 | Discharge: 2018-03-22 | Disposition: A | Discharge status: Home / Self Care | Payer: BLUE CROSS/BLUE SHIELD | Source: Ambulatory Visit | Attending: Radiation Oncology | Admitting: Radiation Oncology

## 2018-03-22 DIAGNOSIS — Z510 Encounter for antineoplastic radiation therapy: Secondary | ICD-10-CM | POA: Diagnosis not present

## 2018-03-25 ENCOUNTER — Ambulatory Visit
Admission: RE | Admit: 2018-03-25 | Discharge: 2018-03-25 | Disposition: A | Discharge status: Home / Self Care | Payer: BLUE CROSS/BLUE SHIELD | Source: Ambulatory Visit | Attending: Radiation Oncology | Admitting: Radiation Oncology

## 2018-03-25 DIAGNOSIS — Z510 Encounter for antineoplastic radiation therapy: Secondary | ICD-10-CM | POA: Diagnosis not present

## 2018-03-26 ENCOUNTER — Ambulatory Visit
Admission: RE | Admit: 2018-03-26 | Discharge: 2018-03-26 | Disposition: A | Discharge status: Home / Self Care | Payer: BLUE CROSS/BLUE SHIELD | Source: Ambulatory Visit | Attending: Radiation Oncology | Admitting: Radiation Oncology

## 2018-03-26 DIAGNOSIS — Z510 Encounter for antineoplastic radiation therapy: Secondary | ICD-10-CM | POA: Diagnosis not present

## 2018-03-27 ENCOUNTER — Ambulatory Visit
Admission: RE | Admit: 2018-03-27 | Discharge: 2018-03-27 | Disposition: A | Discharge status: Home / Self Care | Payer: BLUE CROSS/BLUE SHIELD | Source: Ambulatory Visit | Attending: Radiation Oncology | Admitting: Radiation Oncology

## 2018-03-27 DIAGNOSIS — Z510 Encounter for antineoplastic radiation therapy: Secondary | ICD-10-CM | POA: Diagnosis not present

## 2018-03-28 ENCOUNTER — Ambulatory Visit
Admission: RE | Admit: 2018-03-28 | Discharge: 2018-03-28 | Disposition: A | Discharge status: Home / Self Care | Payer: BLUE CROSS/BLUE SHIELD | Source: Ambulatory Visit | Attending: Radiation Oncology | Admitting: Radiation Oncology

## 2018-03-28 DIAGNOSIS — Z510 Encounter for antineoplastic radiation therapy: Secondary | ICD-10-CM | POA: Diagnosis not present

## 2018-03-29 ENCOUNTER — Ambulatory Visit
Admission: RE | Admit: 2018-03-29 | Discharge: 2018-03-29 | Disposition: A | Discharge status: Home / Self Care | Payer: BLUE CROSS/BLUE SHIELD | Source: Ambulatory Visit | Attending: Radiation Oncology | Admitting: Radiation Oncology

## 2018-03-29 DIAGNOSIS — Z510 Encounter for antineoplastic radiation therapy: Secondary | ICD-10-CM | POA: Diagnosis not present

## 2018-04-01 ENCOUNTER — Ambulatory Visit: Payer: BLUE CROSS/BLUE SHIELD | Admitting: Oncology

## 2018-04-01 ENCOUNTER — Other Ambulatory Visit: Payer: BLUE CROSS/BLUE SHIELD

## 2018-04-01 ENCOUNTER — Ambulatory Visit
Admission: RE | Admit: 2018-04-01 | Discharge: 2018-04-01 | Disposition: A | Discharge status: Home / Self Care | Payer: BLUE CROSS/BLUE SHIELD | Source: Ambulatory Visit | Attending: Radiation Oncology | Admitting: Radiation Oncology

## 2018-04-01 DIAGNOSIS — Z510 Encounter for antineoplastic radiation therapy: Secondary | ICD-10-CM | POA: Diagnosis not present

## 2018-04-02 ENCOUNTER — Ambulatory Visit
Admission: RE | Admit: 2018-04-02 | Discharge: 2018-04-02 | Disposition: A | Discharge status: Home / Self Care | Payer: BLUE CROSS/BLUE SHIELD | Source: Ambulatory Visit | Attending: Radiation Oncology | Admitting: Radiation Oncology

## 2018-04-02 ENCOUNTER — Inpatient Hospital Stay: Payer: BLUE CROSS/BLUE SHIELD

## 2018-04-02 DIAGNOSIS — D0512 Intraductal carcinoma in situ of left breast: Secondary | ICD-10-CM

## 2018-04-02 DIAGNOSIS — Z510 Encounter for antineoplastic radiation therapy: Secondary | ICD-10-CM | POA: Diagnosis not present

## 2018-04-02 LAB — CBC
HEMATOCRIT: 44.3 % (ref 36.0–46.0)
Hemoglobin: 14.5 g/dL (ref 12.0–15.0)
MCH: 28.3 pg (ref 26.0–34.0)
MCHC: 32.7 g/dL (ref 30.0–36.0)
MCV: 86.4 fL (ref 80.0–100.0)
Platelets: 227 10*3/uL (ref 150–400)
RBC: 5.13 MIL/uL — ABNORMAL HIGH (ref 3.87–5.11)
RDW: 12.3 % (ref 11.5–15.5)
WBC: 4.9 10*3/uL (ref 4.0–10.5)
nRBC: 0 % (ref 0.0–0.2)

## 2018-04-03 ENCOUNTER — Ambulatory Visit
Admission: RE | Admit: 2018-04-03 | Discharge: 2018-04-03 | Disposition: A | Discharge status: Home / Self Care | Payer: BLUE CROSS/BLUE SHIELD | Source: Ambulatory Visit | Attending: Radiation Oncology | Admitting: Radiation Oncology

## 2018-04-03 DIAGNOSIS — Z510 Encounter for antineoplastic radiation therapy: Secondary | ICD-10-CM | POA: Diagnosis not present

## 2018-04-04 ENCOUNTER — Ambulatory Visit
Admission: RE | Admit: 2018-04-04 | Discharge: 2018-04-04 | Disposition: A | Discharge status: Home / Self Care | Payer: BLUE CROSS/BLUE SHIELD | Source: Ambulatory Visit | Attending: Radiation Oncology | Admitting: Radiation Oncology

## 2018-04-04 DIAGNOSIS — Z510 Encounter for antineoplastic radiation therapy: Secondary | ICD-10-CM | POA: Diagnosis not present

## 2018-04-05 ENCOUNTER — Other Ambulatory Visit: Payer: Self-pay

## 2018-04-05 ENCOUNTER — Inpatient Hospital Stay: Payer: BLUE CROSS/BLUE SHIELD

## 2018-04-05 ENCOUNTER — Ambulatory Visit
Admission: RE | Admit: 2018-04-05 | Discharge: 2018-04-05 | Disposition: A | Discharge status: Home / Self Care | Payer: BLUE CROSS/BLUE SHIELD | Source: Ambulatory Visit | Attending: Radiation Oncology | Admitting: Radiation Oncology

## 2018-04-05 ENCOUNTER — Inpatient Hospital Stay (HOSPITAL_BASED_OUTPATIENT_CLINIC_OR_DEPARTMENT_OTHER): Payer: BLUE CROSS/BLUE SHIELD | Admitting: Oncology

## 2018-04-05 ENCOUNTER — Encounter: Payer: Self-pay | Admitting: Oncology

## 2018-04-05 VITALS — BP 137/80 | HR 83 | Temp 96.9°F | Resp 18 | Wt 168.9 lb

## 2018-04-05 DIAGNOSIS — Z800 Family history of malignant neoplasm of digestive organs: Secondary | ICD-10-CM

## 2018-04-05 DIAGNOSIS — Z170 Estrogen receptor positive status [ER+]: Secondary | ICD-10-CM | POA: Diagnosis not present

## 2018-04-05 DIAGNOSIS — Z923 Personal history of irradiation: Secondary | ICD-10-CM | POA: Diagnosis not present

## 2018-04-05 DIAGNOSIS — Z79899 Other long term (current) drug therapy: Secondary | ICD-10-CM

## 2018-04-05 DIAGNOSIS — Z8041 Family history of malignant neoplasm of ovary: Secondary | ICD-10-CM

## 2018-04-05 DIAGNOSIS — D0512 Intraductal carcinoma in situ of left breast: Secondary | ICD-10-CM

## 2018-04-05 DIAGNOSIS — Z510 Encounter for antineoplastic radiation therapy: Secondary | ICD-10-CM | POA: Diagnosis not present

## 2018-04-05 DIAGNOSIS — Z801 Family history of malignant neoplasm of trachea, bronchus and lung: Secondary | ICD-10-CM

## 2018-04-05 DIAGNOSIS — Z87891 Personal history of nicotine dependence: Secondary | ICD-10-CM

## 2018-04-05 LAB — CBC WITH DIFFERENTIAL/PLATELET
Abs Immature Granulocytes: 0.01 10*3/uL (ref 0.00–0.07)
BASOS PCT: 1 %
Basophils Absolute: 0 10*3/uL (ref 0.0–0.1)
EOS PCT: 3 %
Eosinophils Absolute: 0.1 10*3/uL (ref 0.0–0.5)
HCT: 43.4 % (ref 36.0–46.0)
Hemoglobin: 14.4 g/dL (ref 12.0–15.0)
Immature Granulocytes: 0 %
Lymphocytes Relative: 22 %
Lymphs Abs: 0.9 10*3/uL (ref 0.7–4.0)
MCH: 28.3 pg (ref 26.0–34.0)
MCHC: 33.2 g/dL (ref 30.0–36.0)
MCV: 85.4 fL (ref 80.0–100.0)
Monocytes Absolute: 0.3 10*3/uL (ref 0.1–1.0)
Monocytes Relative: 8 %
Neutro Abs: 2.9 10*3/uL (ref 1.7–7.7)
Neutrophils Relative %: 66 %
Platelets: 226 10*3/uL (ref 150–400)
RBC: 5.08 MIL/uL (ref 3.87–5.11)
RDW: 12.5 % (ref 11.5–15.5)
WBC: 4.3 10*3/uL (ref 4.0–10.5)
nRBC: 0 % (ref 0.0–0.2)

## 2018-04-05 LAB — COMPREHENSIVE METABOLIC PANEL
ALT: 22 U/L (ref 0–44)
AST: 20 U/L (ref 15–41)
Albumin: 4.2 g/dL (ref 3.5–5.0)
Alkaline Phosphatase: 72 U/L (ref 38–126)
Anion gap: 6 (ref 5–15)
BUN: 16 mg/dL (ref 6–20)
CO2: 27 mmol/L (ref 22–32)
Calcium: 9.3 mg/dL (ref 8.9–10.3)
Chloride: 108 mmol/L (ref 98–111)
Creatinine, Ser: 0.75 mg/dL (ref 0.44–1.00)
GFR calc Af Amer: >60 mL/min (ref >60)
Glucose, Bld: 101 mg/dL — ABNORMAL HIGH (ref 70–99)
Potassium: 4.1 mmol/L (ref 3.5–5.1)
Sodium: 141 mmol/L (ref 135–145)
Total Bilirubin: 0.8 mg/dL (ref 0.3–1.2)
Total Protein: 7.3 g/dL (ref 6.5–8.1)

## 2018-04-05 NOTE — Progress Notes (Signed)
Patient here for follow up. No concerns voiced.  °

## 2018-04-05 NOTE — Progress Notes (Signed)
Hematology/Oncology Consult note [facility] Telephone:([phone] Fax:([phone]) [phone]   Patient Care Team: Rusty Aus, MD as PCP - General (Internal Medicine) Surgeon: Dr.Sakai  REASON FOR VISIT Follow up for treatment of high grade DCIS  HISTORY OF PRESENTING ILLNESS:  Victoria Hammond is a  57 y.o.  female with PMH listed below who was referred to me for evaluation of  Patient had screening mammogram on 11/22/2017 which showed suspicious calcifications.  She underwent digital diagnostic mammogram on 12/05/2017 which confirmed grouped fine pleomorphic calcifications within the upper outer quadrant of the left breast.  Spanning 8 mm.  Patient underwent stereotactic biopsy of left breast calcification. Biopsy pathology showed: High-grade ductal carcinoma in situ, comedo type with associated calcifications.  Nipple discharge: Denies Family history: Maternal grandmother deceased from lung and the brain cancer, maternal uncle deceased from lung cancer.  Maternal uncle deceased from esophageal cancer.  Another maternal uncle deceased from liver cancer.  Negative for breast cancer family history.  Patient declined genetic counseling  OCP use: used OCP between age of [age].  Estrogen and progesterone therapy: denies History of radiation to chest: denies.  Previous breast surgery: History of breast cyst aspiration.  01/11/2018 left breast lumpectomy by Dr. Lysle Pearl.  Pathology showed high-grade DCIS, comedo type with associated calcifications. Size of DCIS at least 4 mm, grade 3, comedo necrosis, margins not involved by DCIS, distance from closest margin 8 mm. pTis pNx Family history of ovarian cancer, lung cancer, esophageal cancer, liver cancer, patient declined genetic counseling.   INTERVAL HISTORY Victoria Hammond is a 57 y.o. 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    Patient has started on adjuvant radiation, will finish adjuvant radiation on 12/31 2019.  Tolerating well except skin toxicities. No new complaints.  Review of Systems  Constitutional: Negative for chills, fever, malaise/fatigue and weight loss.  HENT: Negative for nosebleeds and sore throat.   Eyes: Negative for double vision, photophobia and redness.  Respiratory: Negative for cough, shortness of breath and wheezing.   Cardiovascular: Negative for chest pain, palpitations, orthopnea and leg swelling.  Gastrointestinal: Negative for abdominal pain, blood in stool, nausea and vomiting.  Genitourinary: Negative for dysuria.  Musculoskeletal: Negative for back pain, myalgias and neck pain.  Skin: Negative for itching and rash.       Skin irritation  Neurological: Negative for dizziness, tingling and tremors.  Endo/Heme/Allergies: Negative for environmental allergies. Does not bruise/bleed easily.  Psychiatric/Behavioral: Negative for depression and hallucinations. The patient is nervous/anxious.     MEDICAL HISTORY:  Past Medical History:  Diagnosis Date  . 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 Hashimoto's thyroiditis   . Hypothyroidism   . Vitamin D deficiency     SURGICAL HISTORY: Past Surgical History:  Procedure Laterality Date  . BREAST BIOPSY Left 12/13/2017   affirm stereo biopsy/HIGH-GRADE DUCTAL CARCINOMA IN SITU, COMEDO TYPE   . BREAST CYST ASPIRATION Left 02/16/2005  . BREAST LUMPECTOMY Left 01/11/2018   HIGH-GRADE DUCTAL CARCINOMA IN SITU, COMEDO TYPE   . CERVICAL POLYPECTOMY  03/28/2006  . COLONOSCOPY    . PARTIAL MASTECTOMY WITH NEEDLE LOCALIZATION Left 01/11/2018   Procedure: PARTIAL MASTECTOMY WITH NEEDLE LOCALIZATION;  Surgeon: Benjamine Sprague, DO;  Location: [facility] ORS;  Service: General;  Laterality: Left;    SOCIAL HISTORY: Social History   Socioeconomic History  . Marital status: Married  Spouse name: Not on file  . Number of  children: Not on file  . Years of education: Not on file  . Highest education level: Not on file  Occupational History  . Not on file  Social Needs  . Financial resource strain: Not on file  . Food insecurity:    Worry: Not on file    Inability: Not on file  . Transportation needs:    Medical: Not on file    Non-medical: Not on file  Tobacco Use  . Smoking status: Former Smoker    Packs/day: 1.00    Years: 8.00    Pack years: 8.00    Last attempt to quit: 01/03/1988    Years since quitting: 30.2  . Smokeless tobacco: Never Used  Substance and Sexual Activity  . Alcohol use: Never    Frequency: Never  . Drug use: Never  . Sexual activity: Not on file  Lifestyle  . Physical activity:    Days per week: Not on file    Minutes per session: Not on file  . Stress: Not on file  Relationships  . Social connections:    Talks on phone: Not on file    Gets together: Not on file    Attends religious service: Not on file    Active member of club or organization: Not on file    Attends meetings of clubs or organizations: Not on file    Relationship status: Not on file  . Intimate partner violence:    Fear of current or ex partner: Not on file    Emotionally abused: Not on file    Physically abused: Not on file    Forced sexual activity: Not on file  Other Topics Concern  . Not on file  Social History Narrative  . Not on file    FAMILY HISTORY: Family History  Problem Relation Age of Onset  . Hypertension Mother   . Congestive Heart Failure Father   . Lung cancer Maternal Uncle   . Ovarian cancer Maternal Grandmother        ovarian cancer mets to lung and brain  . Esophageal cancer Maternal Uncle   . Liver cancer Maternal Uncle   . Breast cancer Neg Hx     ALLERGIES:  is allergic to sulfa antibiotics.  MEDICATIONS:  Current Outpatient Medications  Medication Sig Dispense Refill  . ALPRAZolam (XANAX) 0.5 MG tablet Take 0.25 mg by mouth at bedtime as needed for sleep.      . Cholecalciferol (VITAMIN D3) 2000 units capsule Take 2,000 Units by mouth every evening.     . etodolac (LODINE) 400 MG tablet Take 400 mg by mouth every evening.   3  . levothyroxine (SYNTHROID, LEVOTHROID) 75 MCG tablet Take 75 mcg by mouth daily before breakfast.     . vitamin B-12 (CYANOCOBALAMIN) 1000 MCG tablet Take 1,000 mcg by mouth every evening.      No current facility-administered medications for this visit.      PHYSICAL EXAMINATION: ECOG PERFORMANCE STATUS: 0 - Asymptomatic Vitals:   04/05/18 0851  BP: 137/80  Pulse: 83  Resp: 18  Temp: (!) 96.9 F (36.1 C)   Filed Weights   04/05/18 0851  Weight: 168 lb 14.4 oz (76.6 kg)    Physical Exam Constitutional:      General: She is not in acute distress. HENT:     Head: Normocephalic and atraumatic.  Eyes:     General: No scleral icterus.    Pupils:  Pupils are equal, round, and reactive to light.  Neck:     Musculoskeletal: Normal range of motion and neck supple.  Cardiovascular:     Rate and Rhythm: Normal rate and regular rhythm.     Heart sounds: Normal heart sounds.  Pulmonary:     Effort: Pulmonary effort is normal. No respiratory distress.     Breath sounds: No wheezing.  Abdominal:     General: Bowel sounds are normal. There is no distension.     Palpations: Abdomen is soft. There is no mass.     Tenderness: There is no abdominal tenderness.  Musculoskeletal: Normal range of motion.        General: No deformity.  Skin:    General: Skin is warm and dry.     Findings: No erythema or rash.  Neurological:     Mental Status: She is alert and oriented to person, place, and time.     Cranial Nerves: No cranial nerve deficit.     Coordination: Coordination normal.  Psychiatric:        Behavior: Behavior normal.        Thought Content: Thought content normal.       LABORATORY DATA:  I have reviewed the data as listed Lab Results  Component Value Date   WBC 4.3 04/05/2018   HGB 14.4  04/05/2018   HCT 43.4 04/05/2018   MCV 85.4 04/05/2018   PLT 226 04/05/2018   Recent Labs    04/05/18 0841  NA 141  K 4.1  CL 108  CO2 27  GLUCOSE 101*  BUN 16  CREATININE 0.75  CALCIUM 9.3  GFRNONAA >60  GFRAA >60  PROT 7.3  ALBUMIN 4.2  AST 20  ALT 22  ALKPHOS 72  BILITOT 0.8   Iron/TIBC/Ferritin/ %Sat No results found for: IRON, TIBC, FERRITIN, IRONPCTSAT   I have reviewed patient's recent labs done at [facility] clinic.  09/06/2017 Cbc hemoglobin 14.8, hematocrit 43.7, platelet counts 227, WBC 5.7.  Normal differential. Normal creatinine and bilirubin level.  ASSESSMENT & PLAN:  1. Ductal carcinoma in situ (DCIS) of left breast    Diagnosis and care plan were discussed with patient in detail. High-grade DCIS status post lumpectomy with negative margins.  Currently undergoing adjuvant radiation ER weakly positive, recommend adjuvant antiestrogen treatment with tamoxifen or aromatase inhibitor. Postmenopausal with last menstrual period in 2016. Check estradiol, FSH/LH  I discussed in details about rationale of adjuvant antiestrogen treatments. Side effect profile of tamoxifen including but not limited to thrombosis/blood clots, uterine cancer, bone pain, hot flash,  Heart disease, etc. Side effect profile of aromatase inhibitor includes but not limited to hot flashes, body achiness, joint pain, mood swing, bone loss, pathological fracture, etc. Patient would call the office and inform me about her decision.  We spent sufficient time to discuss many aspect of care, questions were answered to patient's satisfaction.   Return of visit: 6 weeks.  Total face to face encounter time for this patient visit was 25 min. >50% of the time was  spent in counseling and coordination of care.      Earlie Server, MD, PhD Hematology Oncology [facility] Pager- [phone] 04/05/2018

## 2018-04-06 LAB — FSH/LH
FSH: 95.9 m[IU]/mL
LH: 41.2 m[IU]/mL

## 2018-04-06 LAB — ESTRADIOL: Estradiol: <5 pg/mL

## 2018-04-08 ENCOUNTER — Ambulatory Visit
Admission: RE | Admit: 2018-04-08 | Discharge: 2018-04-08 | Disposition: A | Discharge status: Home / Self Care | Payer: BLUE CROSS/BLUE SHIELD | Source: Ambulatory Visit | Attending: Radiation Oncology | Admitting: Radiation Oncology

## 2018-04-08 DIAGNOSIS — Z510 Encounter for antineoplastic radiation therapy: Secondary | ICD-10-CM | POA: Diagnosis not present

## 2018-04-09 ENCOUNTER — Ambulatory Visit
Admission: RE | Admit: 2018-04-09 | Discharge: 2018-04-09 | Disposition: A | Discharge status: Home / Self Care | Payer: BLUE CROSS/BLUE SHIELD | Source: Ambulatory Visit | Attending: Radiation Oncology | Admitting: Radiation Oncology

## 2018-04-09 DIAGNOSIS — Z510 Encounter for antineoplastic radiation therapy: Secondary | ICD-10-CM | POA: Diagnosis not present

## 2018-04-11 ENCOUNTER — Ambulatory Visit
Admission: RE | Admit: 2018-04-11 | Discharge: 2018-04-11 | Disposition: A | Discharge status: Home / Self Care | Payer: BLUE CROSS/BLUE SHIELD | Source: Ambulatory Visit | Attending: Radiation Oncology | Admitting: Radiation Oncology

## 2018-04-11 DIAGNOSIS — Z510 Encounter for antineoplastic radiation therapy: Secondary | ICD-10-CM | POA: Diagnosis not present

## 2018-04-12 ENCOUNTER — Ambulatory Visit
Admission: RE | Admit: 2018-04-12 | Discharge: 2018-04-12 | Disposition: A | Discharge status: Home / Self Care | Payer: BLUE CROSS/BLUE SHIELD | Source: Ambulatory Visit | Attending: Radiation Oncology | Admitting: Radiation Oncology

## 2018-04-12 DIAGNOSIS — Z510 Encounter for antineoplastic radiation therapy: Secondary | ICD-10-CM | POA: Diagnosis not present

## 2018-04-15 ENCOUNTER — Ambulatory Visit
Admission: RE | Admit: 2018-04-15 | Discharge: 2018-04-15 | Disposition: A | Discharge status: Home / Self Care | Payer: BLUE CROSS/BLUE SHIELD | Source: Ambulatory Visit | Attending: Radiation Oncology | Admitting: Radiation Oncology

## 2018-04-15 DIAGNOSIS — Z510 Encounter for antineoplastic radiation therapy: Secondary | ICD-10-CM | POA: Diagnosis not present

## 2018-04-16 ENCOUNTER — Ambulatory Visit
Admission: RE | Admit: 2018-04-16 | Discharge: 2018-04-16 | Disposition: A | Discharge status: Home / Self Care | Payer: BLUE CROSS/BLUE SHIELD | Source: Ambulatory Visit | Attending: Radiation Oncology | Admitting: Radiation Oncology

## 2018-04-16 DIAGNOSIS — Z510 Encounter for antineoplastic radiation therapy: Secondary | ICD-10-CM | POA: Diagnosis not present

## 2018-04-18 ENCOUNTER — Other Ambulatory Visit: Payer: Self-pay | Admitting: Oncology

## 2018-04-18 ENCOUNTER — Telehealth: Payer: Self-pay | Admitting: *Deleted

## 2018-04-18 MED ORDER — TAMOXIFEN CITRATE 20 MG PO TABS: 20.0000 mg | ORAL_TABLET | Freq: Every day | ORAL | 3 refills | Status: DC

## 2018-04-18 NOTE — Telephone Encounter (Signed)
Returned pt's call - Patient states that she has decided to take Tamoxifen.  Please sent Rx to [facility].

## 2018-04-18 NOTE — Telephone Encounter (Signed)
Rx sent 

## 2018-04-18 NOTE — Telephone Encounter (Signed)
Patient called asking that Dr Tasia Catchings or her nurse return her call as she has made a decision on what medicine she wants to take. [phone], or cell [phone]

## 2018-05-17 ENCOUNTER — Inpatient Hospital Stay: Payer: BLUE CROSS/BLUE SHIELD | Attending: Oncology | Admitting: Oncology

## 2018-05-17 ENCOUNTER — Ambulatory Visit
Admission: RE | Admit: 2018-05-17 | Discharge: 2018-05-17 | Disposition: A | Discharge status: Home / Self Care | Payer: BLUE CROSS/BLUE SHIELD | Source: Ambulatory Visit | Attending: Radiation Oncology | Admitting: Radiation Oncology

## 2018-05-17 ENCOUNTER — Inpatient Hospital Stay: Payer: BLUE CROSS/BLUE SHIELD

## 2018-05-17 ENCOUNTER — Other Ambulatory Visit: Payer: Self-pay

## 2018-05-17 ENCOUNTER — Encounter: Payer: Self-pay | Admitting: Oncology

## 2018-05-17 VITALS — BP 132/80 | HR 76 | Temp 96.8°F | Resp 18 | Wt 169.4 lb

## 2018-05-17 DIAGNOSIS — Z170 Estrogen receptor positive status [ER+]: Secondary | ICD-10-CM | POA: Diagnosis not present

## 2018-05-17 DIAGNOSIS — Z809 Family history of malignant neoplasm, unspecified: Secondary | ICD-10-CM

## 2018-05-17 DIAGNOSIS — Z808 Family history of malignant neoplasm of other organs or systems: Secondary | ICD-10-CM | POA: Insufficient documentation

## 2018-05-17 DIAGNOSIS — D0512 Intraductal carcinoma in situ of left breast: Secondary | ICD-10-CM

## 2018-05-17 DIAGNOSIS — Z87891 Personal history of nicotine dependence: Secondary | ICD-10-CM | POA: Diagnosis not present

## 2018-05-17 DIAGNOSIS — Z923 Personal history of irradiation: Secondary | ICD-10-CM | POA: Diagnosis not present

## 2018-05-17 DIAGNOSIS — Z800 Family history of malignant neoplasm of digestive organs: Secondary | ICD-10-CM | POA: Diagnosis not present

## 2018-05-17 DIAGNOSIS — Z8041 Family history of malignant neoplasm of ovary: Secondary | ICD-10-CM | POA: Diagnosis not present

## 2018-05-17 DIAGNOSIS — Z79899 Other long term (current) drug therapy: Secondary | ICD-10-CM | POA: Insufficient documentation

## 2018-05-17 DIAGNOSIS — Z801 Family history of malignant neoplasm of trachea, bronchus and lung: Secondary | ICD-10-CM | POA: Diagnosis not present

## 2018-05-17 DIAGNOSIS — Z79810 Long term (current) use of selective estrogen receptor modulators (SERMs): Secondary | ICD-10-CM | POA: Insufficient documentation

## 2018-05-17 LAB — COMPREHENSIVE METABOLIC PANEL
ALT: 18 U/L (ref 0–44)
AST: 21 U/L (ref 15–41)
Albumin: 4.2 g/dL (ref 3.5–5.0)
Alkaline Phosphatase: 68 U/L (ref 38–126)
Anion gap: 7 (ref 5–15)
BUN: 14 mg/dL (ref 6–20)
CO2: 28 mmol/L (ref 22–32)
Calcium: 9.2 mg/dL (ref 8.9–10.3)
Chloride: 109 mmol/L (ref 98–111)
Creatinine, Ser: 0.61 mg/dL (ref 0.44–1.00)
GFR calc Af Amer: >60 mL/min (ref >60)
GFR calc non Af Amer: >60 mL/min (ref >60)
Glucose, Bld: 113 mg/dL — ABNORMAL HIGH (ref 70–99)
Potassium: 4.3 mmol/L (ref 3.5–5.1)
SODIUM: 144 mmol/L (ref 135–145)
Total Bilirubin: 0.6 mg/dL (ref 0.3–1.2)
Total Protein: 7.4 g/dL (ref 6.5–8.1)

## 2018-05-17 LAB — CBC WITH DIFFERENTIAL/PLATELET
ABS IMMATURE GRANULOCYTES: 0.05 10*3/uL (ref 0.00–0.07)
Basophils Absolute: 0 10*3/uL (ref 0.0–0.1)
Basophils Relative: 0 %
Eosinophils Absolute: 0.1 10*3/uL (ref 0.0–0.5)
Eosinophils Relative: 3 %
HCT: 44.6 % (ref 36.0–46.0)
Hemoglobin: 14.4 g/dL (ref 12.0–15.0)
IMMATURE GRANULOCYTES: 1 %
LYMPHS PCT: 26 %
Lymphs Abs: 1.2 10*3/uL (ref 0.7–4.0)
MCH: 28.1 pg (ref 26.0–34.0)
MCHC: 32.3 g/dL (ref 30.0–36.0)
MCV: 87.1 fL (ref 80.0–100.0)
Monocytes Absolute: 0.3 10*3/uL (ref 0.1–1.0)
Monocytes Relative: 7 %
Neutro Abs: 3 10*3/uL (ref 1.7–7.7)
Neutrophils Relative %: 63 %
Platelets: 202 10*3/uL (ref 150–400)
RBC: 5.12 MIL/uL — ABNORMAL HIGH (ref 3.87–5.11)
RDW: 12.6 % (ref 11.5–15.5)
WBC: 4.7 10*3/uL (ref 4.0–10.5)
nRBC: 0 % (ref 0.0–0.2)

## 2018-05-17 NOTE — Progress Notes (Signed)
Patient here for follow up. No concerns voiced.  °

## 2018-05-17 NOTE — Progress Notes (Signed)
Radiation Oncology Follow up Note  Name: Victoria Hammond   Date:   05/17/2018 MRN:  [id number] DOB: Apr 29, 1960    This 58 y.o. female presents to the clinic today for one-month follow-up status post whole breast radiation to her left breast for ER/PR positive ductal carcinoma in situ.  REFERRING PROVIDER: Rusty Aus, MD  HPI: patient is a 58 year old female now out 1 month having completed whole breast radiation to her left breast for ER/PR positive ductal carcinoma in situ 1 month prior. Seen today in routine follow-up she is doing well. She specifically denies breast tenderness cough or bone pain..she started tamoxifen tolerating that well without side effect.  COMPLICATIONS OF TREATMENT: none  FOLLOW UP COMPLIANCE: keeps appointments   PHYSICAL EXAM:  There were no vitals taken for this visit. Lungs are clear to A&P cardiac examination essentially unremarkable with regular rate and rhythm. No dominant mass or nodularity is noted in either breast in 2 positions examined. Incision is well-healed. No axillary or supraclavicular adenopathy is appreciated. Cosmetic result is excellent.Well-developed well-nourished patient in NAD. HEENT reveals PERLA, EOMI, discs not visualized.  Oral cavity is clear. No oral mucosal lesions are identified. Neck is clear without evidence of cervical or supraclavicular adenopathy. Lungs are clear to A&P. Cardiac examination is essentially unremarkable with regular rate and rhythm without murmur rub or thrill. Abdomen is benign with no organomegaly or masses noted. Motor sensory and DTR levels are equal and symmetric in the upper and lower extremities. Cranial nerves II through XII are grossly intact. Proprioception is intact. No peripheral adenopathy or edema is identified. No motor or sensory levels are noted. Crude visual fields are within normal range.  RADIOLOGY RESULTS: no current films for review  PLAN: present time patient is doing well 1 month out with  no significant side effects. I'm please were overall progress. She is started tamoxifen without side effect. I have asked to see her back in 4-5 months for follow-up. Patient knows to call with any concerns at any time.  I would like to take this opportunity to thank you for allowing me to participate in the care of your patient.Noreene Filbert, MD

## 2018-05-17 NOTE — Progress Notes (Signed)
- Hematology/Oncology follow up  note [facility] [phone] [phone]   Patient Care Team: Rusty Aus, MD as PCP - General (Internal Medicine) Surgeon: Dr.Sakai  REASON FOR VISIT Follow up for treatment of high grade DCIS  HISTORY OF PRESENTING ILLNESS:  Victoria Hammond is a  58 y.o.  female with PMH listed below who was referred to me for evaluation of  Patient had screening mammogram on 11/22/2017 which showed suspicious calcifications.  She underwent digital diagnostic mammogram on 12/05/2017 which confirmed grouped fine pleomorphic calcifications within the upper outer quadrant of the left breast.  Spanning 8 mm.  Patient underwent stereotactic biopsy of left breast calcification. Biopsy pathology showed: High-grade ductal carcinoma in situ, comedo type with associated calcifications.  Nipple discharge: Denies Family history: Maternal grandmother deceased from lung and the brain cancer, maternal uncle deceased from lung cancer.  Maternal uncle deceased from esophageal cancer.  Another maternal uncle deceased from liver cancer.  Negative for breast cancer family history.  Patient declined genetic counseling  OCP use: used OCP between age of [age].  Estrogen and progesterone therapy: denies History of radiation to chest: denies.  Previous breast surgery: History of breast cyst aspiration.  01/11/2018 left breast lumpectomy by Dr. Lysle Pearl.  Pathology showed high-grade DCIS, comedo type with associated calcifications. Size of DCIS at least 4 mm, grade 3, comedo necrosis, margins not involved by DCIS, distance from closest margin 8 mm. pTis pNx Family history of ovarian cancer, lung cancer, esophageal cancer, liver cancer, patient declined genetic counseling.  # finished adjuvant radiation on 12/31/ 2019 #Started on tamoxifen 20 mg daily in January 2020. INTERVAL HISTORY Victoria Hammond is a 58 y.o. female who has above history reviewed by me  today presents for follow up visit for management of high-grade DCIS, ER weakly positive[ 1-10%}.  Patient has been on tamoxifen, tolerating well.  Denies hot flash, nausea, abdominal pain, lower extremity swelling. No new complaints.  Review of Systems  Constitutional: Negative for chills, fever, malaise/fatigue and weight loss.  HENT: Negative for nosebleeds and sore throat.   Eyes: Negative for double vision, photophobia and redness.  Respiratory: Negative for cough, shortness of breath and wheezing.   Cardiovascular: Negative for chest pain, palpitations, orthopnea and leg swelling.  Gastrointestinal: Negative for abdominal pain, blood in stool, nausea and vomiting.  Genitourinary: Negative for dysuria.  Musculoskeletal: Negative for back pain, myalgias and neck pain.  Skin: Negative for itching and rash.       Skin irritation  Neurological: Negative for dizziness, tingling and tremors.  Endo/Heme/Allergies: Negative for environmental allergies. Does not bruise/bleed easily.  Psychiatric/Behavioral: Negative for depression and hallucinations. The patient is nervous/anxious.     MEDICAL HISTORY:  Past Medical History:  Diagnosis Date  . 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 Hashimoto's thyroiditis   . Hypothyroidism   . Vitamin D deficiency     SURGICAL HISTORY: Past Surgical History:  Procedure Laterality Date  . BREAST BIOPSY Left 12/13/2017   affirm stereo biopsy/HIGH-GRADE DUCTAL CARCINOMA IN SITU, COMEDO TYPE   . BREAST CYST ASPIRATION Left 02/16/2005  . BREAST LUMPECTOMY Left 01/11/2018   HIGH-GRADE DUCTAL CARCINOMA IN SITU, COMEDO TYPE   . CERVICAL POLYPECTOMY  03/28/2006  . COLONOSCOPY    . PARTIAL MASTECTOMY WITH NEEDLE LOCALIZATION Left 01/11/2018   Procedure: PARTIAL MASTECTOMY WITH NEEDLE LOCALIZATION;  Surgeon: Benjamine Sprague, DO;  Location: [facility] ORS;  Service: General;  Laterality: Left;  SOCIAL HISTORY: Social History   Socioeconomic  History  . Marital status: Married    Spouse name: Not on file  . Number of children: Not on file  . Years of education: Not on file  . Highest education level: Not on file  Occupational History  . Not on file  Social Needs  . Financial resource strain: Not on file  . Food insecurity:    Worry: Not on file    Inability: Not on file  . Transportation needs:    Medical: Not on file    Non-medical: Not on file  Tobacco Use  . Smoking status: Former Smoker    Packs/day: 1.00    Years: 8.00    Pack years: 8.00    Last attempt to quit: 01/03/1988    Years since quitting: 30.3  . Smokeless tobacco: Never Used  Substance and Sexual Activity  . Alcohol use: Never    Frequency: Never  . Drug use: Never  . Sexual activity: Not on file  Lifestyle  . Physical activity:    Days per week: Not on file    Minutes per session: Not on file  . Stress: Not on file  Relationships  . Social connections:    Talks on phone: Not on file    Gets together: Not on file    Attends religious service: Not on file    Active member of club or organization: Not on file    Attends meetings of clubs or organizations: Not on file    Relationship status: Not on file  . Intimate partner violence:    Fear of current or ex partner: Not on file    Emotionally abused: Not on file    Physically abused: Not on file    Forced sexual activity: Not on file  Other Topics Concern  . Not on file  Social History Narrative  . Not on file    FAMILY HISTORY: Family History  Problem Relation Age of Onset  . Hypertension Mother   . Congestive Heart Failure Father   . Lung cancer Maternal Uncle   . Ovarian cancer Maternal Grandmother        ovarian cancer mets to lung and brain  . Esophageal cancer Maternal Uncle   . Liver cancer Maternal Uncle   . Breast cancer Neg Hx     ALLERGIES:  is allergic to sulfa antibiotics.  MEDICATIONS:  Current Outpatient Medications  Medication Sig Dispense Refill  .  ALPRAZolam (XANAX) 0.5 MG tablet Take 0.25 mg by mouth at bedtime as needed for sleep.     . Cholecalciferol (VITAMIN D3) 2000 units capsule Take 2,000 Units by mouth every evening.     . etodolac (LODINE) 400 MG tablet Take 400 mg by mouth every evening.   3  . levothyroxine (SYNTHROID, LEVOTHROID) 75 MCG tablet Take 75 mcg by mouth daily before breakfast.     . Multiple Vitamins-Minerals (CENTRUM SILVER PO) Take 1 tablet by mouth daily.    . tamoxifen (NOLVADEX) 20 MG tablet Take 1 tablet (20 mg total) by mouth daily. 30 tablet 3  . vitamin B-12 (CYANOCOBALAMIN) 1000 MCG tablet Take 1,000 mcg by mouth every evening.     . Vitamin E 400 units TABS Take 1 tablet by mouth daily.     No current facility-administered medications for this visit.      PHYSICAL EXAMINATION: ECOG PERFORMANCE STATUS: 0 - Asymptomatic Vitals:   05/17/18 0830  BP: 132/80  Pulse: 76  Resp:  18  Temp: (!) 96.8 F (36 C)   Filed Weights   05/17/18 0830  Weight: 169 lb 6.4 oz (76.8 kg)    Physical Exam Constitutional:      General: She is not in acute distress. HENT:     Head: Normocephalic and atraumatic.  Eyes:     General: No scleral icterus.    Pupils: Pupils are equal, round, and reactive to light.  Neck:     Musculoskeletal: Normal range of motion and neck supple.  Cardiovascular:     Rate and Rhythm: Normal rate and regular rhythm.     Heart sounds: Normal heart sounds.  Pulmonary:     Effort: Pulmonary effort is normal. No respiratory distress.     Breath sounds: No wheezing.  Abdominal:     General: Bowel sounds are normal. There is no distension.     Palpations: Abdomen is soft. There is no mass.     Tenderness: There is no abdominal tenderness.  Musculoskeletal: Normal range of motion.        General: No deformity.  Skin:    General: Skin is warm and dry.     Findings: No erythema or rash.  Neurological:     Mental Status: She is alert and oriented to person, place, and time.      Cranial Nerves: No cranial nerve deficit.     Coordination: Coordination normal.  Psychiatric:        Behavior: Behavior normal.        Thought Content: Thought content normal.       LABORATORY DATA:  I have reviewed the data as listed Lab Results  Component Value Date   WBC 4.7 05/17/2018   HGB 14.4 05/17/2018   HCT 44.6 05/17/2018   MCV 87.1 05/17/2018   PLT 202 05/17/2018   Recent Labs    04/05/18 0841  NA 141  K 4.1  CL 108  CO2 27  GLUCOSE 101*  BUN 16  CREATININE 0.75  CALCIUM 9.3  GFRNONAA >60  GFRAA >60  PROT 7.3  ALBUMIN 4.2  AST 20  ALT 22  ALKPHOS 72  BILITOT 0.8   Iron/TIBC/Ferritin/ %Sat No results found for: IRON, TIBC, FERRITIN, IRONPCTSAT   I have reviewed patient's recent labs done at [facility] clinic.  09/06/2017 Cbc hemoglobin 14.8, hematocrit 43.7, platelet counts 227, WBC 5.7.  Normal differential. Normal creatinine and bilirubin level.  ASSESSMENT & PLAN:  1. Ductal carcinoma in situ (DCIS) of left breast   2. Family history of cancer   3. Long-term current use of tamoxifen    Diagnosis and care plan were discussed with patient in detail. High-grade DCIS status post lumpectomy with negative margins.  Status post adjuvant radiation ER weakly positive, recommend adjuvant antiestrogen treatment Reviewed patient's labs. Postmenopausal with last menstrual period in 2016  Estradiol less than 5, FSH 95.9.  LH 41.2 consistent with postmenopausal. Tolerates well.  Continue tamoxifen 20 mg daily for 5 years. Advised patient to follow-up with GYN for annual check up.   #Family history of ovarian cancer, liver cancer, esophageal cancer, lung cancer.  Have discussed with patient about genetic counselor.  She declined.  Return of visit: 6 weeks.        Earlie Server, MD, PhD Hematology Oncology [facility] Pager- [phone] 05/17/2018

## 2018-06-13 ENCOUNTER — Telehealth: Payer: Self-pay | Admitting: *Deleted

## 2018-06-13 NOTE — Telephone Encounter (Signed)
Patient called reporting that she completed radiation therapy in Dec and is having soreness in her left armpit and is asking if that could be form the Tamoxifen. Please advise what she is to do.

## 2018-06-13 NOTE — Telephone Encounter (Signed)
Called pt, scheduled an appt for evaluation

## 2018-06-13 NOTE — Telephone Encounter (Signed)
Don't think that's tamoxifen. Wondering if any ongoing inflammation or infection.  I am happy to see her for assessment

## 2018-06-14 ENCOUNTER — Other Ambulatory Visit: Payer: Self-pay

## 2018-06-14 ENCOUNTER — Encounter: Payer: Self-pay | Admitting: Oncology

## 2018-06-14 ENCOUNTER — Inpatient Hospital Stay: Payer: BLUE CROSS/BLUE SHIELD | Attending: Oncology | Admitting: Oncology

## 2018-06-14 VITALS — BP 134/72 | HR 71 | Temp 96.1°F | Wt 169.1 lb

## 2018-06-14 DIAGNOSIS — Z923 Personal history of irradiation: Secondary | ICD-10-CM | POA: Diagnosis not present

## 2018-06-14 DIAGNOSIS — Z79810 Long term (current) use of selective estrogen receptor modulators (SERMs): Secondary | ICD-10-CM

## 2018-06-14 DIAGNOSIS — Z87891 Personal history of nicotine dependence: Secondary | ICD-10-CM | POA: Insufficient documentation

## 2018-06-14 DIAGNOSIS — D0512 Intraductal carcinoma in situ of left breast: Secondary | ICD-10-CM | POA: Diagnosis present

## 2018-06-14 DIAGNOSIS — Z8041 Family history of malignant neoplasm of ovary: Secondary | ICD-10-CM | POA: Diagnosis not present

## 2018-06-14 DIAGNOSIS — Z809 Family history of malignant neoplasm, unspecified: Secondary | ICD-10-CM

## 2018-06-14 DIAGNOSIS — Z79899 Other long term (current) drug therapy: Secondary | ICD-10-CM | POA: Insufficient documentation

## 2018-06-14 DIAGNOSIS — Z170 Estrogen receptor positive status [ER+]: Secondary | ICD-10-CM

## 2018-06-14 DIAGNOSIS — Z800 Family history of malignant neoplasm of digestive organs: Secondary | ICD-10-CM | POA: Insufficient documentation

## 2018-06-14 NOTE — Progress Notes (Signed)
Patient here today for evaluation of left armpit which has been sore and tight feeling when she raised her arm, started approx 2 weeks ago.

## 2018-06-15 NOTE — Progress Notes (Signed)
- Hematology/Oncology follow up  note [facility] [phone] [phone]   Patient Care Team: Rusty Aus, MD as PCP - General (Internal Medicine) Surgeon: Dr.Sakai  REASON FOR VISIT Follow up for treatment of high grade DCIS  HISTORY OF PRESENTING ILLNESS:  Victoria Hammond is a  58 y.o.  female with PMH listed below who was referred to me for evaluation of  Patient had screening mammogram on 11/22/2017 which showed suspicious calcifications.  She underwent digital diagnostic mammogram on 12/05/2017 which confirmed grouped fine pleomorphic calcifications within the upper outer quadrant of the left breast.  Spanning 8 mm.  Patient underwent stereotactic biopsy of left breast calcification. Biopsy pathology showed: High-grade ductal carcinoma in situ, comedo type with associated calcifications.  Nipple discharge: Denies Family history: Maternal grandmother deceased from lung and the brain cancer, maternal uncle deceased from lung cancer.  Maternal uncle deceased from esophageal cancer.  Another maternal uncle deceased from liver cancer.  Negative for breast cancer family history.  Patient declined genetic counseling  OCP use: used OCP between age of [age].  Estrogen and progesterone therapy: denies History of radiation to chest: denies.  Previous breast surgery: History of breast cyst aspiration.  01/11/2018 left breast lumpectomy by Dr. Lysle Pearl.  Pathology showed high-grade DCIS, comedo type with associated calcifications. Size of DCIS at least 4 mm, grade 3, comedo necrosis, margins not involved by DCIS, distance from closest margin 8 mm. pTis pNx Family history of ovarian cancer, lung cancer, esophageal cancer, liver cancer, patient declined genetic counseling.  # finished adjuvant radiation on 12/31/ 2019 #Started on tamoxifen 20 mg daily in January 2020.  INTERVAL HISTORY Victoria Hammond is a 58 y.o. female who has above history reviewed by  me today presents for evaluation of left axillary tightness and soreness. Patient called and was concerned. Patient was offered an apoointment today for further evaluation.  She history of left breast high-grade DCIS, ER weakly positive[ 1-10%}. S/p lumpectomy and radiation.  On tamoxifen, tolerating well.  She niticed left axillary area tightness and soreness, usually triggered by raising left arm above shoulder, onset was about 1 months ago, intermittent. Not associated with fever, chills, skin changes, lumps or bumps.   Review of Systems  Constitutional: Negative for chills, fever, malaise/fatigue and weight loss.  HENT: Negative for nosebleeds and sore throat.   Eyes: Negative for double vision, photophobia and redness.  Respiratory: Negative for cough, shortness of breath and wheezing.   Cardiovascular: Negative for chest pain, palpitations, orthopnea and leg swelling.  Gastrointestinal: Negative for abdominal pain, blood in stool, nausea and vomiting.  Genitourinary: Negative for dysuria.  Musculoskeletal: Negative for back pain, myalgias and neck pain.  Skin: Negative for itching and rash.  Neurological: Negative for dizziness, tingling and tremors.  Endo/Heme/Allergies: Negative for environmental allergies. Does not bruise/bleed easily.  Psychiatric/Behavioral: Negative for depression and hallucinations. The patient is nervous/anxious.     MEDICAL HISTORY:  Past Medical History:  Diagnosis Date  . 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 Hashimoto's thyroiditis   . Hypothyroidism   . Vitamin D deficiency     SURGICAL HISTORY: Past Surgical History:  Procedure Laterality Date  . BREAST BIOPSY Left 12/13/2017   affirm stereo biopsy/HIGH-GRADE DUCTAL CARCINOMA IN SITU, COMEDO TYPE   . BREAST CYST ASPIRATION Left 02/16/2005  . BREAST LUMPECTOMY Left 01/11/2018   HIGH-GRADE DUCTAL CARCINOMA IN SITU, COMEDO TYPE   . CERVICAL POLYPECTOMY  03/28/2006  .  COLONOSCOPY    . PARTIAL MASTECTOMY WITH NEEDLE LOCALIZATION Left 01/11/2018   Procedure: PARTIAL MASTECTOMY WITH NEEDLE LOCALIZATION;  Surgeon: Benjamine Sprague, DO;  Location: [facility] ORS;  Service: General;  Laterality: Left;    SOCIAL HISTORY: Social History   Socioeconomic History  . Marital status: Married    Spouse name: Not on file  . Number of children: Not on file  . Years of education: Not on file  . Highest education level: Not on file  Occupational History  . Not on file  Social Needs  . Financial resource strain: Not on file  . Food insecurity:    Worry: Not on file    Inability: Not on file  . Transportation needs:    Medical: Not on file    Non-medical: Not on file  Tobacco Use  . Smoking status: Former Smoker    Packs/day: 1.00    Years: 8.00    Pack years: 8.00    Last attempt to quit: 01/03/1988    Years since quitting: 30.4  . Smokeless tobacco: Never Used  Substance and Sexual Activity  . Alcohol use: Never    Frequency: Never  . Drug use: Never  . Sexual activity: Not on file  Lifestyle  . Physical activity:    Days per week: Not on file    Minutes per session: Not on file  . Stress: Not on file  Relationships  . Social connections:    Talks on phone: Not on file    Gets together: Not on file    Attends religious service: Not on file    Active member of club or organization: Not on file    Attends meetings of clubs or organizations: Not on file    Relationship status: Not on file  . Intimate partner violence:    Fear of current or ex partner: Not on file    Emotionally abused: Not on file    Physically abused: Not on file    Forced sexual activity: Not on file  Other Topics Concern  . Not on file  Social History Narrative  . Not on file    FAMILY HISTORY: Family History  Problem Relation Age of Onset  . Hypertension Mother   . Congestive Heart Failure Father   . Lung cancer Maternal Uncle   . Ovarian cancer Maternal Grandmother         ovarian cancer mets to lung and brain  . Esophageal cancer Maternal Uncle   . Liver cancer Maternal Uncle   . Breast cancer Neg Hx     ALLERGIES:  is allergic to sulfa antibiotics.  MEDICATIONS:  Current Outpatient Medications  Medication Sig Dispense Refill  . ALPRAZolam (XANAX) 0.5 MG tablet Take 0.25 mg by mouth at bedtime as needed for sleep.     . Cholecalciferol (VITAMIN D3) 2000 units capsule Take 2,000 Units by mouth every evening.     . etodolac (LODINE) 400 MG tablet Take 400 mg by mouth every evening.   3  . levothyroxine (SYNTHROID, LEVOTHROID) 75 MCG tablet Take 75 mcg by mouth daily before breakfast.     . Multiple Vitamins-Minerals (CENTRUM SILVER PO) Take 1 tablet by mouth daily.    . tamoxifen (NOLVADEX) 20 MG tablet Take 1 tablet (20 mg total) by mouth daily. 30 tablet 3  . vitamin B-12 (CYANOCOBALAMIN) 1000 MCG tablet Take 1,000 mcg by mouth every evening.     . Vitamin E 400 units TABS Take 1 tablet by mouth daily.  No current facility-administered medications for this visit.      PHYSICAL EXAMINATION: ECOG PERFORMANCE STATUS: 0 - Asymptomatic Vitals:   06/14/18 0847  BP: 134/72  Pulse: 71  Temp: (!) 96.1 F (35.6 C)   Filed Weights   06/14/18 0847  Weight: 169 lb 1 oz (76.7 kg)    Physical Exam Constitutional:      General: She is not in acute distress. HENT:     Head: Normocephalic and atraumatic.  Eyes:     General: No scleral icterus.    Pupils: Pupils are equal, round, and reactive to light.  Neck:     Musculoskeletal: Normal range of motion and neck supple.  Cardiovascular:     Rate and Rhythm: Normal rate and regular rhythm.     Heart sounds: Normal heart sounds.  Pulmonary:     Effort: Pulmonary effort is normal. No respiratory distress.     Breath sounds: No wheezing.  Abdominal:     General: Bowel sounds are normal. There is no distension.     Palpations: Abdomen is soft. There is no mass.     Tenderness: There is no abdominal  tenderness.  Musculoskeletal: Normal range of motion.        General: No deformity.  Skin:    General: Skin is warm and dry.     Findings: No erythema or rash.  Neurological:     Mental Status: She is alert and oriented to person, place, and time.     Cranial Nerves: No cranial nerve deficit.     Coordination: Coordination normal.  Psychiatric:        Behavior: Behavior normal.        Thought Content: Thought content normal.   Breast exam was performed in seated and lying down position. Status post left breast lumpectomy.  No palpable breast mass or lymphadenopathy. No skin fluctuation or erythema.       LABORATORY DATA:  I have reviewed the data as listed Lab Results  Component Value Date   WBC 4.7 05/17/2018   HGB 14.4 05/17/2018   HCT 44.6 05/17/2018   MCV 87.1 05/17/2018   PLT 202 05/17/2018   Recent Labs    04/05/18 0841 05/17/18 0758  NA 141 144  K 4.1 4.3  CL 108 109  CO2 27 28  GLUCOSE 101* 113*  BUN 16 14  CREATININE 0.75 0.61  CALCIUM 9.3 9.2  GFRNONAA >60 >60  GFRAA >60 >60  PROT 7.3 7.4  ALBUMIN 4.2 4.2  AST 20 21  ALT 22 18  ALKPHOS 72 68  BILITOT 0.8 0.6   Iron/TIBC/Ferritin/ %Sat No results found for: IRON, TIBC, FERRITIN, IRONPCTSAT   I have reviewed patient's recent labs done at [facility] clinic.  09/06/2017 Cbc hemoglobin 14.8, hematocrit 43.7, platelet counts 227, WBC 5.7.  Normal differential. Normal creatinine and bilirubin level.  ASSESSMENT & PLAN:  1. Ductal carcinoma in situ (DCIS) of left breast   2. Long-term current use of tamoxifen   3. Family history of cancer    ER weakly positive,tolerates adjuvant Tamoxifen well.  Examination is benign.No palpable mass or lymphadenopathy.  She is due for annual mammogram in August 2020.  Reassurance was provided.  #Family history of ovarian cancer, liver cancer, esophageal cancer, lung cancer.  Have discussed with patient about genetic counselor.  She declined.  Return of visit: keep  her original appointment.        Earlie Server, MD, PhD Hematology Oncology [facility] at  [place] Pager- [phone]

## 2018-07-14 ENCOUNTER — Other Ambulatory Visit: Payer: Self-pay | Admitting: Oncology

## 2018-08-15 ENCOUNTER — Ambulatory Visit: Payer: BLUE CROSS/BLUE SHIELD | Admitting: Oncology

## 2018-08-15 ENCOUNTER — Other Ambulatory Visit: Payer: BLUE CROSS/BLUE SHIELD

## 2018-08-22 ENCOUNTER — Other Ambulatory Visit: Payer: Self-pay

## 2018-08-23 ENCOUNTER — Inpatient Hospital Stay: Payer: BLUE CROSS/BLUE SHIELD | Attending: Oncology

## 2018-08-23 ENCOUNTER — Other Ambulatory Visit: Payer: Self-pay

## 2018-08-23 ENCOUNTER — Inpatient Hospital Stay (HOSPITAL_BASED_OUTPATIENT_CLINIC_OR_DEPARTMENT_OTHER): Payer: BLUE CROSS/BLUE SHIELD | Admitting: Oncology

## 2018-08-23 ENCOUNTER — Encounter: Payer: Self-pay | Admitting: Oncology

## 2018-08-23 ENCOUNTER — Encounter (INDEPENDENT_AMBULATORY_CARE_PROVIDER_SITE_OTHER): Payer: Self-pay

## 2018-08-23 DIAGNOSIS — E063 Autoimmune thyroiditis: Secondary | ICD-10-CM | POA: Diagnosis not present

## 2018-08-23 DIAGNOSIS — Z809 Family history of malignant neoplasm, unspecified: Secondary | ICD-10-CM

## 2018-08-23 DIAGNOSIS — Z8041 Family history of malignant neoplasm of ovary: Secondary | ICD-10-CM | POA: Insufficient documentation

## 2018-08-23 DIAGNOSIS — E559 Vitamin D deficiency, unspecified: Secondary | ICD-10-CM | POA: Diagnosis not present

## 2018-08-23 DIAGNOSIS — Z923 Personal history of irradiation: Secondary | ICD-10-CM | POA: Diagnosis not present

## 2018-08-23 DIAGNOSIS — Z800 Family history of malignant neoplasm of digestive organs: Secondary | ICD-10-CM | POA: Diagnosis not present

## 2018-08-23 DIAGNOSIS — D0512 Intraductal carcinoma in situ of left breast: Secondary | ICD-10-CM | POA: Insufficient documentation

## 2018-08-23 DIAGNOSIS — Z87891 Personal history of nicotine dependence: Secondary | ICD-10-CM | POA: Diagnosis not present

## 2018-08-23 DIAGNOSIS — Z79810 Long term (current) use of selective estrogen receptor modulators (SERMs): Secondary | ICD-10-CM | POA: Insufficient documentation

## 2018-08-23 DIAGNOSIS — Z801 Family history of malignant neoplasm of trachea, bronchus and lung: Secondary | ICD-10-CM | POA: Diagnosis not present

## 2018-08-23 DIAGNOSIS — Z882 Allergy status to sulfonamides status: Secondary | ICD-10-CM | POA: Diagnosis not present

## 2018-08-23 DIAGNOSIS — Z79899 Other long term (current) drug therapy: Secondary | ICD-10-CM | POA: Diagnosis not present

## 2018-08-23 LAB — COMPREHENSIVE METABOLIC PANEL
ALT: 17 U/L (ref 0–44)
AST: 19 U/L (ref 15–41)
Albumin: 3.9 g/dL (ref 3.5–5.0)
Alkaline Phosphatase: 61 U/L (ref 38–126)
Anion gap: 5 (ref 5–15)
BUN: 16 mg/dL (ref 6–20)
CO2: 28 mmol/L (ref 22–32)
Calcium: 9.1 mg/dL (ref 8.9–10.3)
Chloride: 108 mmol/L (ref 98–111)
Creatinine, Ser: 0.73 mg/dL (ref 0.44–1.00)
GFR calc Af Amer: >60 mL/min (ref >60)
GFR calc non Af Amer: >60 mL/min (ref >60)
Glucose, Bld: 109 mg/dL — ABNORMAL HIGH (ref 70–99)
Potassium: 5 mmol/L (ref 3.5–5.1)
Sodium: 141 mmol/L (ref 135–145)
Total Bilirubin: 0.6 mg/dL (ref 0.3–1.2)
Total Protein: 7.4 g/dL (ref 6.5–8.1)

## 2018-08-23 LAB — CBC WITH DIFFERENTIAL/PLATELET
Abs Immature Granulocytes: 0.01 10*3/uL (ref 0.00–0.07)
Basophils Absolute: 0 10*3/uL (ref 0.0–0.1)
Basophils Relative: 1 %
Eosinophils Absolute: 0.2 10*3/uL (ref 0.0–0.5)
Eosinophils Relative: 3 %
HCT: 43.6 % (ref 36.0–46.0)
Hemoglobin: 14.6 g/dL (ref 12.0–15.0)
Immature Granulocytes: 0 %
Lymphocytes Relative: 29 %
Lymphs Abs: 1.6 10*3/uL (ref 0.7–4.0)
MCH: 29.3 pg (ref 26.0–34.0)
MCHC: 33.5 g/dL (ref 30.0–36.0)
MCV: 87.4 fL (ref 80.0–100.0)
Monocytes Absolute: 0.5 10*3/uL (ref 0.1–1.0)
Monocytes Relative: 8 %
Neutro Abs: 3.1 10*3/uL (ref 1.7–7.7)
Neutrophils Relative %: 59 %
Platelets: 221 10*3/uL (ref 150–400)
RBC: 4.99 MIL/uL (ref 3.87–5.11)
RDW: 12.6 % (ref 11.5–15.5)
WBC: 5.3 10*3/uL (ref 4.0–10.5)
nRBC: 0 % (ref 0.0–0.2)

## 2018-08-23 NOTE — Progress Notes (Signed)
Patient contacted for virtual visit. Patient has had some weight gain and is wondering if it is a side effect of tamoxifen.

## 2018-08-25 NOTE — Progress Notes (Signed)
HEMATOLOGY-ONCOLOGY TeleHEALTH VISIT PROGRESS NOTE  I connected with Victoria Hammond on 08/25/18 at  1:00 PM EDT by video enabled telemedicine visit and verified that I am speaking with the correct person using two identifiers. I discussed the limitations, risks, security and privacy concerns of performing an evaluation and management service by telemedicine and the availability of in-person appointments. I also discussed with the patient that there may be a patient responsible charge related to this service. The patient expressed understanding and agreed to proceed.   Other persons participating in the visit and their role in the encounter:  Geraldine Solar, [place], check in patient   Janeann Merl, RN, check in patient.   Patient's location: Home  Provider's location: Work Risk analyst Complaint: Follow-up for high-grade [place] is a 58 y.o. female who has above history reviewed by me today presents for follow up visit for management of high-grade DCIS, ER weakly positive[1 to 10%], status post lumpectomy and radiation. problems and complaints are listed below:  Patient has been taking tamoxifen, tolerating with mild side effects. Continue to have chronic body aches and hot flash, manageable. Denies any fever, chills, nausea, vomiting, vaginal bleeding, abdominal pain, chest pain, leg swelling. Denies any concerns of her breast   Review of Systems  Constitutional: Negative for appetite change, chills, fatigue and fever.  HENT:   Negative for hearing loss and voice change.   Eyes: Negative for eye problems.  Respiratory: Negative for chest tightness and cough.   Cardiovascular: Negative for chest pain.  Gastrointestinal: Negative for abdominal distention, abdominal pain and blood in stool.  Endocrine: Positive for hot flashes.  Genitourinary: Negative for difficulty urinating and frequency.   Musculoskeletal:       Body aches  Skin: Negative for itching and  rash.  Neurological: Negative for extremity weakness.  Hematological: Negative for adenopathy.  Psychiatric/Behavioral: Negative for confusion.    Past Medical History:  Diagnosis Date  . 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 Hashimoto's thyroiditis   . Hypothyroidism   . Vitamin D deficiency    Past Surgical History:  Procedure Laterality Date  . BREAST BIOPSY Left 12/13/2017   affirm stereo biopsy/HIGH-GRADE DUCTAL CARCINOMA IN SITU, COMEDO TYPE   . BREAST CYST ASPIRATION Left 02/16/2005  . BREAST LUMPECTOMY Left 01/11/2018   HIGH-GRADE DUCTAL CARCINOMA IN SITU, COMEDO TYPE   . CERVICAL POLYPECTOMY  03/28/2006  . COLONOSCOPY    . PARTIAL MASTECTOMY WITH NEEDLE LOCALIZATION Left 01/11/2018   Procedure: PARTIAL MASTECTOMY WITH NEEDLE LOCALIZATION;  Surgeon: Benjamine Sprague, DO;  Location: [facility] ORS;  Service: General;  Laterality: Left;    Family History  Problem Relation Age of Onset  . Hypertension Mother   . Congestive Heart Failure Father   . Lung cancer Maternal Uncle   . Ovarian cancer Maternal Grandmother        ovarian cancer mets to lung and brain  . Esophageal cancer Maternal Uncle   . Liver cancer Maternal Uncle   . Breast cancer Neg Hx     Social History   Socioeconomic History  . Marital status: Married    Spouse name: Not on file  . Number of children: Not on file  . Years of education: Not on file  . Highest education level: Not on file  Occupational History  . Not on file  Social Needs  . Financial resource strain: Not on file  . Food insecurity:    Worry: Not  on file    Inability: Not on file  . Transportation needs:    Medical: Not on file    Non-medical: Not on file  Tobacco Use  . Smoking status: Former Smoker    Packs/day: 1.00    Years: 8.00    Pack years: 8.00    Last attempt to quit: 01/03/1988    Years since quitting: 30.6  . Smokeless tobacco: Never Used  Substance and Sexual Activity  . Alcohol use: Never     Frequency: Never  . Drug use: Never  . Sexual activity: Not on file  Lifestyle  . Physical activity:    Days per week: Not on file    Minutes per session: Not on file  . Stress: Not on file  Relationships  . Social connections:    Talks on phone: Not on file    Gets together: Not on file    Attends religious service: Not on file    Active member of club or organization: Not on file    Attends meetings of clubs or organizations: Not on file    Relationship status: Not on file  . Intimate partner violence:    Fear of current or ex partner: Not on file    Emotionally abused: Not on file    Physically abused: Not on file    Forced sexual activity: Not on file  Other Topics Concern  . Not on file  Social History Narrative  . Not on file    Current Outpatient Medications on File Prior to Visit  Medication Sig Dispense Refill  . ALPRAZolam (XANAX) 0.5 MG tablet Take 0.25 mg by mouth at bedtime as needed for sleep.     . Cholecalciferol (VITAMIN D3) 2000 units capsule Take 2,000 Units by mouth every evening.     . etodolac (LODINE) 400 MG tablet Take 400 mg by mouth every evening.   3  . levothyroxine (SYNTHROID, LEVOTHROID) 75 MCG tablet Take 75 mcg by mouth daily before breakfast.     . Multiple Vitamins-Minerals (CENTRUM SILVER PO) Take 1 tablet by mouth daily.    . tamoxifen (NOLVADEX) 20 MG tablet TAKE 1 TABLET BY MOUTH EVERY DAY 90 tablet 1  . vitamin B-12 (CYANOCOBALAMIN) 1000 MCG tablet Take 1,000 mcg by mouth every evening.     . Vitamin E 400 units TABS Take 1 tablet by mouth daily.     No current facility-administered medications on file prior to visit.     Allergies  Allergen Reactions  . Sulfa Antibiotics Nausea And Vomiting       Observations/Objective: There were no vitals filed for this visit. There is no height or weight on file to calculate BMI.  Pain level 0 Physical Exam  Constitutional: She is oriented to person, place, and time. No distress.  HENT:   Head: Normocephalic and atraumatic.  Nose: Nose normal.  Mouth/Throat: Oropharynx is clear and moist. No oropharyngeal exudate.  Eyes: Pupils are equal, round, and reactive to light. EOM are normal. No scleral icterus.  Neck: Normal range of motion. Neck supple.  Cardiovascular: Normal rate and regular rhythm.  No murmur heard. Pulmonary/Chest: Effort normal. No respiratory distress. She has no rales. She exhibits no tenderness.  Abdominal: Soft. She exhibits no distension. There is no abdominal tenderness.  Musculoskeletal: Normal range of motion.        General: No edema.  Neurological: She is alert and oriented to person, place, and time. No cranial nerve deficit. She exhibits normal muscle  tone. Coordination normal.  Skin: Skin is warm and dry. She is not diaphoretic. No erythema.  Psychiatric: Affect normal.    CBC    Component Value Date/Time   WBC 5.3 08/23/2018 1000   RBC 4.99 08/23/2018 1000   HGB 14.6 08/23/2018 1000   HCT 43.6 08/23/2018 1000   PLT 221 08/23/2018 1000   MCV 87.4 08/23/2018 1000   MCH 29.3 08/23/2018 1000   MCHC 33.5 08/23/2018 1000   RDW 12.6 08/23/2018 1000   LYMPHSABS 1.6 08/23/2018 1000   MONOABS 0.5 08/23/2018 1000   EOSABS 0.2 08/23/2018 1000   BASOSABS 0.0 08/23/2018 1000    CMP     Component Value Date/Time   NA 141 08/23/2018 1000   K 5.0 08/23/2018 1000   CL 108 08/23/2018 1000   CO2 28 08/23/2018 1000   GLUCOSE 109 (H) 08/23/2018 1000   BUN 16 08/23/2018 1000   CREATININE 0.73 08/23/2018 1000   CALCIUM 9.1 08/23/2018 1000   PROT 7.4 08/23/2018 1000   ALBUMIN 3.9 08/23/2018 1000   AST 19 08/23/2018 1000   ALT 17 08/23/2018 1000   ALKPHOS 61 08/23/2018 1000   BILITOT 0.6 08/23/2018 1000   GFRNONAA >60 08/23/2018 1000   GFRAA >60 08/23/2018 1000     Assessment and Plan: 1. Ductal carcinoma in situ (DCIS) of left breast   2. Long-term current use of tamoxifen   3. Family history of cancer     Labs are reviewed and  discussed with the patient. Clinically she is doing satisfactorily.  Tolerating tamoxifen with mild vasovagal symptoms. Recommend continue tamoxifen for total of 5 years. Continue annual screening mammogram.  Patient reports she usually gets mammogram ordered through her GYN physician's office.  She will call if she wants me to place the order.  Long-term current use of tamoxifen, recommend patient to have annual cholesterol check and GYN evaluation. Family history of cancer, patient previously declined genetic testing  Follow Up Instructions: Lab MD 3 months.  CBC CMP.   I discussed the assessment and treatment plan with the patient. The patient was provided an opportunity to ask questions and all were answered. The patient agreed with the plan and demonstrated an understanding of the instructions.  The patient was advised to call back or seek an in-person evaluation if the symptoms worsen or if the condition fails to improve as anticipated.    Earlie Server, MD 08/25/2018 1:45 PM

## 2018-10-31 ENCOUNTER — Other Ambulatory Visit: Payer: Self-pay | Admitting: Obstetrics and Gynecology

## 2018-10-31 DIAGNOSIS — Z1231 Encounter for screening mammogram for malignant neoplasm of breast: Secondary | ICD-10-CM

## 2018-11-01 ENCOUNTER — Ambulatory Visit: Payer: BLUE CROSS/BLUE SHIELD | Admitting: Radiation Oncology

## 2018-11-25 ENCOUNTER — Inpatient Hospital Stay: Payer: BLUE CROSS/BLUE SHIELD | Admitting: Oncology

## 2018-11-25 ENCOUNTER — Inpatient Hospital Stay: Payer: BLUE CROSS/BLUE SHIELD

## 2018-11-26 ENCOUNTER — Other Ambulatory Visit: Payer: Self-pay

## 2018-11-26 ENCOUNTER — Ambulatory Visit
Admission: RE | Admit: 2018-11-26 | Discharge: 2018-11-26 | Disposition: A | Discharge status: Home / Self Care | Payer: BLUE CROSS/BLUE SHIELD | Source: Ambulatory Visit | Attending: Obstetrics and Gynecology | Admitting: Obstetrics and Gynecology

## 2018-11-26 DIAGNOSIS — Z1231 Encounter for screening mammogram for malignant neoplasm of breast: Secondary | ICD-10-CM | POA: Diagnosis not present

## 2018-12-13 ENCOUNTER — Inpatient Hospital Stay (HOSPITAL_BASED_OUTPATIENT_CLINIC_OR_DEPARTMENT_OTHER): Payer: BC Managed Care – PPO | Admitting: Oncology

## 2018-12-13 ENCOUNTER — Encounter: Payer: Self-pay | Admitting: Oncology

## 2018-12-13 ENCOUNTER — Other Ambulatory Visit: Payer: Self-pay

## 2018-12-13 ENCOUNTER — Inpatient Hospital Stay: Payer: BC Managed Care – PPO | Attending: Oncology

## 2018-12-13 VITALS — BP 130/100 | HR 78 | Temp 98.6°F | Wt 171.0 lb

## 2018-12-13 DIAGNOSIS — Z79810 Long term (current) use of selective estrogen receptor modulators (SERMs): Secondary | ICD-10-CM

## 2018-12-13 DIAGNOSIS — D0512 Intraductal carcinoma in situ of left breast: Secondary | ICD-10-CM

## 2018-12-13 DIAGNOSIS — Z809 Family history of malignant neoplasm, unspecified: Secondary | ICD-10-CM | POA: Diagnosis not present

## 2018-12-13 DIAGNOSIS — Z801 Family history of malignant neoplasm of trachea, bronchus and lung: Secondary | ICD-10-CM | POA: Insufficient documentation

## 2018-12-13 DIAGNOSIS — E039 Hypothyroidism, unspecified: Secondary | ICD-10-CM | POA: Insufficient documentation

## 2018-12-13 DIAGNOSIS — Z170 Estrogen receptor positive status [ER+]: Secondary | ICD-10-CM | POA: Insufficient documentation

## 2018-12-13 DIAGNOSIS — Z800 Family history of malignant neoplasm of digestive organs: Secondary | ICD-10-CM | POA: Diagnosis not present

## 2018-12-13 DIAGNOSIS — Z923 Personal history of irradiation: Secondary | ICD-10-CM | POA: Diagnosis not present

## 2018-12-13 DIAGNOSIS — Z87891 Personal history of nicotine dependence: Secondary | ICD-10-CM | POA: Diagnosis not present

## 2018-12-13 DIAGNOSIS — E559 Vitamin D deficiency, unspecified: Secondary | ICD-10-CM | POA: Insufficient documentation

## 2018-12-13 DIAGNOSIS — Z79899 Other long term (current) drug therapy: Secondary | ICD-10-CM | POA: Insufficient documentation

## 2018-12-13 DIAGNOSIS — Z8041 Family history of malignant neoplasm of ovary: Secondary | ICD-10-CM | POA: Insufficient documentation

## 2018-12-13 LAB — CBC WITH DIFFERENTIAL/PLATELET
Abs Immature Granulocytes: 0.01 10*3/uL (ref 0.00–0.07)
Basophils Absolute: 0 10*3/uL (ref 0.0–0.1)
Basophils Relative: 0 %
Eosinophils Absolute: 0.1 10*3/uL (ref 0.0–0.5)
Eosinophils Relative: 2 %
HCT: 41.3 % (ref 36.0–46.0)
Hemoglobin: 13.8 g/dL (ref 12.0–15.0)
Immature Granulocytes: 0 %
Lymphocytes Relative: 24 %
Lymphs Abs: 1.6 10*3/uL (ref 0.7–4.0)
MCH: 29.3 pg (ref 26.0–34.0)
MCHC: 33.4 g/dL (ref 30.0–36.0)
MCV: 87.7 fL (ref 80.0–100.0)
Monocytes Absolute: 0.4 10*3/uL (ref 0.1–1.0)
Monocytes Relative: 5 %
Neutro Abs: 4.6 10*3/uL (ref 1.7–7.7)
Neutrophils Relative %: 69 %
Platelets: 210 10*3/uL (ref 150–400)
RBC: 4.71 MIL/uL (ref 3.87–5.11)
RDW: 12.4 % (ref 11.5–15.5)
WBC: 6.7 10*3/uL (ref 4.0–10.5)
nRBC: 0 % (ref 0.0–0.2)

## 2018-12-13 LAB — COMPREHENSIVE METABOLIC PANEL
ALT: 24 U/L (ref 0–44)
AST: 22 U/L (ref 15–41)
Albumin: 3.9 g/dL (ref 3.5–5.0)
Alkaline Phosphatase: 56 U/L (ref 38–126)
Anion gap: 7 (ref 5–15)
BUN: 12 mg/dL (ref 6–20)
CO2: 25 mmol/L (ref 22–32)
Calcium: 9 mg/dL (ref 8.9–10.3)
Chloride: 110 mmol/L (ref 98–111)
Creatinine, Ser: 0.73 mg/dL (ref 0.44–1.00)
GFR calc Af Amer: >60 mL/min (ref >60)
GFR calc non Af Amer: >60 mL/min (ref >60)
Glucose, Bld: 113 mg/dL — ABNORMAL HIGH (ref 70–99)
Potassium: 4.3 mmol/L (ref 3.5–5.1)
Sodium: 142 mmol/L (ref 135–145)
Total Bilirubin: 0.4 mg/dL (ref 0.3–1.2)
Total Protein: 6.9 g/dL (ref 6.5–8.1)

## 2018-12-13 NOTE — Progress Notes (Signed)
- Hematology/Oncology follow up  note [facility] Telephone:(336) [phone] Fax:(336) [phone]   Patient Care Team: Rusty Aus, MD as PCP - General (Internal Medicine) Surgeon: Dr.Sakai  REASON FOR VISIT Follow up for treatment of high grade DCIS  HISTORY OF PRESENTING ILLNESS:  Victoria Hammond is a  58 y.o.  female with PMH listed below who was referred to me for evaluation of  Patient had screening mammogram on 11/22/2017 which showed suspicious calcifications.  She underwent digital diagnostic mammogram on 12/05/2017 which confirmed grouped fine pleomorphic calcifications within the upper outer quadrant of the left breast.  Spanning 8 mm.  Patient underwent stereotactic biopsy of left breast calcification. Biopsy pathology showed: High-grade ductal carcinoma in situ, comedo type with associated calcifications.  Nipple discharge: Denies Family history: Maternal grandmother deceased from lung and the brain cancer, maternal uncle deceased from lung cancer.  Maternal uncle deceased from esophageal cancer.  Another maternal uncle deceased from liver cancer.  Negative for breast cancer family history.  Patient declined genetic counseling  OCP use: used OCP between age of [age].  Estrogen and progesterone therapy: denies History of radiation to chest: denies.  Previous breast surgery: History of breast cyst aspiration.  01/11/2018 left breast lumpectomy by Dr. Lysle Pearl.  Pathology showed high-grade DCIS, comedo type with associated calcifications. Size of DCIS at least 4 mm, grade 3, comedo necrosis, margins not involved by DCIS, distance from closest margin 8 mm. pTis pNx Family history of ovarian cancer, lung cancer, esophageal cancer, liver cancer, patient declined genetic counseling.  # finished adjuvant radiation on 12/31/ 2019 #Started on tamoxifen 20 mg daily in January 2020.  INTERVAL HISTORY Victoria Hammond is a 58 y.o. 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 S/p lumpectomy and radiation.  Patient is currently on tamoxifen 20 mg daily.  Reports tolerating well. Denies any concerns of her breast. No new complaints. Review of Systems  Constitutional: Negative for chills, fever, malaise/fatigue and weight loss.  HENT: Negative for nosebleeds and sore throat.   Eyes: Negative for double vision, photophobia and redness.  Respiratory: Negative for cough, shortness of breath and wheezing.   Cardiovascular: Negative for chest pain, palpitations, orthopnea and leg swelling.  Gastrointestinal: Negative for abdominal pain, blood in stool, nausea and vomiting.  Genitourinary: Negative for dysuria.  Musculoskeletal: Negative for back pain, myalgias and neck pain.  Skin: Negative for itching and rash.  Neurological: Negative for dizziness, tingling and tremors.  Endo/Heme/Allergies: Negative for environmental allergies. Does not bruise/bleed easily.  Psychiatric/Behavioral: Negative for depression and hallucinations. The patient is not nervous/anxious.     MEDICAL HISTORY:  Past Medical History:  Diagnosis Date  . 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 Hashimoto's thyroiditis   . Hypothyroidism   . Personal history of radiation therapy 2019  . Vitamin D deficiency     SURGICAL HISTORY: Past Surgical History:  Procedure Laterality Date  . BREAST BIOPSY Left 12/13/2017   affirm stereo biopsy/HIGH-GRADE DUCTAL CARCINOMA IN SITU, COMEDO TYPE   . BREAST CYST ASPIRATION Left 02/16/2005  . BREAST LUMPECTOMY Left 01/11/2018   HIGH-GRADE DUCTAL CARCINOMA IN SITU, COMEDO TYPE   . CERVICAL POLYPECTOMY  03/28/2006  . COLONOSCOPY    . PARTIAL MASTECTOMY WITH NEEDLE LOCALIZATION Left 01/11/2018   Procedure: PARTIAL MASTECTOMY WITH NEEDLE LOCALIZATION;  Surgeon: Benjamine Sprague, DO;  Location: [facility]  ORS;  Service:  General;  Laterality: Left;    SOCIAL HISTORY: Social History   Socioeconomic History  . Marital status: Married    Spouse name: Not on file  . Number of children: Not on file  . Years of education: Not on file  . Highest education level: Not on file  Occupational History  . Not on file  Social Needs  . Financial resource strain: Not on file  . Food insecurity    Worry: Not on file    Inability: Not on file  . Transportation needs    Medical: Not on file    Non-medical: Not on file  Tobacco Use  . Smoking status: Former Smoker    Packs/day: 1.00    Years: 8.00    Pack years: 8.00    Quit date: 01/03/1988    Years since quitting: 30.9  . Smokeless tobacco: Never Used  Substance and Sexual Activity  . Alcohol use: Never    Frequency: Never  . Drug use: Never  . Sexual activity: Not on file  Lifestyle  . Physical activity    Days per week: Not on file    Minutes per session: Not on file  . Stress: Not on file  Relationships  . Social Herbalist on phone: Not on file    Gets together: Not on file    Attends religious service: Not on file    Active member of club or organization: Not on file    Attends meetings of clubs or organizations: Not on file    Relationship status: Not on file  . Intimate partner violence    Fear of current or ex partner: Not on file    Emotionally abused: Not on file    Physically abused: Not on file    Forced sexual activity: Not on file  Other Topics Concern  . Not on file  Social History Narrative  . Not on file    FAMILY HISTORY: Family History  Problem Relation Age of Onset  . Hypertension Mother   . Congestive Heart Failure Father   . Lung cancer Maternal Uncle   . Ovarian cancer Maternal Grandmother        ovarian cancer mets to lung and brain  . Esophageal cancer Maternal Uncle   . Liver cancer Maternal Uncle   . Breast cancer Neg Hx     ALLERGIES:  is allergic to sulfa antibiotics and chlorhexidine  gluconate.  MEDICATIONS:  Current Outpatient Medications  Medication Sig Dispense Refill  . ALPRAZolam (XANAX) 0.5 MG tablet Take 0.25 mg by mouth at bedtime as needed for sleep.     . Cholecalciferol (VITAMIN D3) 2000 units capsule Take 2,000 Units by mouth every evening.     . etodolac (LODINE) 400 MG tablet Take 400 mg by mouth every evening.   3  . levothyroxine (SYNTHROID, LEVOTHROID) 75 MCG tablet Take 75 mcg by mouth daily before breakfast.     . Multiple Vitamins-Minerals (CENTRUM SILVER PO) Take 1 tablet by mouth daily.    . tamoxifen (NOLVADEX) 20 MG tablet TAKE 1 TABLET BY MOUTH EVERY DAY 90 tablet 1  . vitamin B-12 (CYANOCOBALAMIN) 1000 MCG tablet Take 1,000 mcg by mouth every evening.     . Vitamin E 400 units TABS Take 1 tablet by mouth daily.     No current facility-administered medications for this visit.      PHYSICAL EXAMINATION: ECOG PERFORMANCE STATUS: 0 - Asymptomatic Vitals:   12/13/18  1403  BP: (!) 130/100  Pulse: 78  Temp: 98.6 F (37 C)   Filed Weights   12/13/18 1403  Weight: 171 lb (77.6 kg)    Physical Exam Constitutional:      General: She is not in acute distress. HENT:     Head: Normocephalic and atraumatic.  Eyes:     General: No scleral icterus.    Pupils: Pupils are equal, round, and reactive to light.  Neck:     Musculoskeletal: Normal range of motion and neck supple.  Cardiovascular:     Rate and Rhythm: Normal rate and regular rhythm.     Heart sounds: Normal heart sounds.  Pulmonary:     Effort: Pulmonary effort is normal. No respiratory distress.     Breath sounds: No wheezing.  Abdominal:     General: Bowel sounds are normal. There is no distension.     Palpations: Abdomen is soft. There is no mass.     Tenderness: There is no abdominal tenderness.  Musculoskeletal: Normal range of motion.        General: No deformity.  Skin:    General: Skin is warm and dry.     Findings: No erythema or rash.  Neurological:     Mental  Status: She is alert and oriented to person, place, and time.     Cranial Nerves: No cranial nerve deficit.     Coordination: Coordination normal.  Psychiatric:        Behavior: Behavior normal.        Thought Content: Thought content normal.   Breast exam was performed in seated and lying down position. Status post left breast lumpectomy.  No palpable breast mass or lymphadenopathy. No skin fluctuation or erythema.       LABORATORY DATA:  I have reviewed the data as listed Lab Results  Component Value Date   WBC 6.7 12/13/2018   HGB 13.8 12/13/2018   HCT 41.3 12/13/2018   MCV 87.7 12/13/2018   PLT 210 12/13/2018   Recent Labs    05/17/18 0758 08/23/18 1000 12/13/18 1345  NA 144 141 142  K 4.3 5.0 4.3  CL 109 108 110  CO2 28 28 25   GLUCOSE 113* 109* 113*  BUN 14 16 12   CREATININE 0.61 0.73 0.73  CALCIUM 9.2 9.1 9.0  GFRNONAA >60 >60 >60  GFRAA >60 >60 >60  PROT 7.4 7.4 6.9  ALBUMIN 4.2 3.9 3.9  AST 21 19 22   ALT 18 17 24   ALKPHOS 68 61 56  BILITOT 0.6 0.6 0.4   Iron/TIBC/Ferritin/ %Sat No results found for: IRON, TIBC, FERRITIN, IRONPCTSAT   I have reviewed patient's recent labs done at [facility] clinic.  09/06/2017 Cbc hemoglobin 14.8, hematocrit 43.7, platelet counts 227, WBC 5.7.  Normal differential. Normal creatinine and bilirubin level.  ASSESSMENT & PLAN:  1. Ductal carcinoma in situ (DCIS) of left breast   2. Long-term current use of tamoxifen   3. Family history of cancer    ER weakly positive,tolerates adjuvant Tamoxifen well.  Continue adjuvant tamoxifen, she tolerates well.  Plan for total of 5 years.  11/26/2018 annual diagnostic mammogram images were independently reviewed by me discussed with patient. No mammographic evidence of disease recurrence Continue annual mammogram screening. Recommend patient continue follow-up with GYN oncology for annual checkup.  We discussed about genetic testing.  She declined. Return of visit: 6 months.         Earlie Server, MD, PhD Hematology Oncology [facility]  at [facility] Pager- [phone]

## 2018-12-13 NOTE — Progress Notes (Signed)
Patient here today for follow up.  Patient denies any nausea, vomiting, diarrhea, constipation or pain.  No new concerns with breast, had mammogram yesterday

## 2019-01-25 ENCOUNTER — Other Ambulatory Visit: Payer: Self-pay | Admitting: Oncology

## 2019-05-21 DIAGNOSIS — K2100 Gastro-esophageal reflux disease with esophagitis, without bleeding: Secondary | ICD-10-CM | POA: Insufficient documentation

## 2019-06-12 ENCOUNTER — Encounter: Payer: Self-pay | Admitting: Oncology

## 2019-06-12 NOTE — Progress Notes (Signed)
Patient contacted for follow up visit. No new breast issues.

## 2019-06-13 ENCOUNTER — Inpatient Hospital Stay: Payer: BC Managed Care – PPO | Attending: Oncology | Admitting: Oncology

## 2019-06-13 ENCOUNTER — Other Ambulatory Visit: Payer: Self-pay

## 2019-06-13 ENCOUNTER — Inpatient Hospital Stay: Payer: BC Managed Care – PPO

## 2019-06-13 VITALS — BP 142/79 | HR 83 | Temp 97.1°F | Resp 16 | Wt 170.9 lb

## 2019-06-13 DIAGNOSIS — Z79899 Other long term (current) drug therapy: Secondary | ICD-10-CM | POA: Diagnosis not present

## 2019-06-13 DIAGNOSIS — Z800 Family history of malignant neoplasm of digestive organs: Secondary | ICD-10-CM | POA: Insufficient documentation

## 2019-06-13 DIAGNOSIS — D0512 Intraductal carcinoma in situ of left breast: Secondary | ICD-10-CM | POA: Diagnosis not present

## 2019-06-13 DIAGNOSIS — Z79810 Long term (current) use of selective estrogen receptor modulators (SERMs): Secondary | ICD-10-CM

## 2019-06-13 DIAGNOSIS — E063 Autoimmune thyroiditis: Secondary | ICD-10-CM | POA: Insufficient documentation

## 2019-06-13 DIAGNOSIS — Z8041 Family history of malignant neoplasm of ovary: Secondary | ICD-10-CM | POA: Diagnosis not present

## 2019-06-13 DIAGNOSIS — E559 Vitamin D deficiency, unspecified: Secondary | ICD-10-CM | POA: Insufficient documentation

## 2019-06-13 DIAGNOSIS — Z801 Family history of malignant neoplasm of trachea, bronchus and lung: Secondary | ICD-10-CM | POA: Insufficient documentation

## 2019-06-13 DIAGNOSIS — Z170 Estrogen receptor positive status [ER+]: Secondary | ICD-10-CM | POA: Insufficient documentation

## 2019-06-13 DIAGNOSIS — Z923 Personal history of irradiation: Secondary | ICD-10-CM | POA: Insufficient documentation

## 2019-06-13 DIAGNOSIS — Z87891 Personal history of nicotine dependence: Secondary | ICD-10-CM | POA: Insufficient documentation

## 2019-06-13 DIAGNOSIS — Z8249 Family history of ischemic heart disease and other diseases of the circulatory system: Secondary | ICD-10-CM | POA: Diagnosis not present

## 2019-06-13 LAB — COMPREHENSIVE METABOLIC PANEL
ALT: 18 U/L (ref 0–44)
AST: 17 U/L (ref 15–41)
Albumin: 3.9 g/dL (ref 3.5–5.0)
Alkaline Phosphatase: 51 U/L (ref 38–126)
Anion gap: 9 (ref 5–15)
BUN: 17 mg/dL (ref 6–20)
CO2: 25 mmol/L (ref 22–32)
Calcium: 9.1 mg/dL (ref 8.9–10.3)
Chloride: 108 mmol/L (ref 98–111)
Creatinine, Ser: 0.55 mg/dL (ref 0.44–1.00)
GFR calc Af Amer: >60 mL/min (ref >60)
GFR calc non Af Amer: >60 mL/min (ref >60)
Glucose, Bld: 87 mg/dL (ref 70–99)
Potassium: 4.2 mmol/L (ref 3.5–5.1)
Sodium: 142 mmol/L (ref 135–145)
Total Bilirubin: 0.4 mg/dL (ref 0.3–1.2)
Total Protein: 7.2 g/dL (ref 6.5–8.1)

## 2019-06-13 LAB — CBC WITH DIFFERENTIAL/PLATELET
Abs Immature Granulocytes: 0.02 10*3/uL (ref 0.00–0.07)
Basophils Absolute: 0 10*3/uL (ref 0.0–0.1)
Basophils Relative: 1 %
Eosinophils Absolute: 0.1 10*3/uL (ref 0.0–0.5)
Eosinophils Relative: 2 %
HCT: 41.5 % (ref 36.0–46.0)
Hemoglobin: 13.8 g/dL (ref 12.0–15.0)
Immature Granulocytes: 0 %
Lymphocytes Relative: 27 %
Lymphs Abs: 1.7 10*3/uL (ref 0.7–4.0)
MCH: 29.6 pg (ref 26.0–34.0)
MCHC: 33.3 g/dL (ref 30.0–36.0)
MCV: 88.9 fL (ref 80.0–100.0)
Monocytes Absolute: 0.5 10*3/uL (ref 0.1–1.0)
Monocytes Relative: 8 %
Neutro Abs: 3.9 10*3/uL (ref 1.7–7.7)
Neutrophils Relative %: 62 %
Platelets: 210 10*3/uL (ref 150–400)
RBC: 4.67 MIL/uL (ref 3.87–5.11)
RDW: 12.3 % (ref 11.5–15.5)
WBC: 6.3 10*3/uL (ref 4.0–10.5)
nRBC: 0 % (ref 0.0–0.2)

## 2019-06-14 NOTE — Progress Notes (Signed)
- Hematology/Oncology follow up  note [facility] Telephone:(336) [phone] Fax:(336) [phone]   Patient Care Team: Rusty Aus, MD as PCP - General (Internal Medicine) Surgeon: Dr.Sakai  REASON FOR VISIT Follow up for treatment of high grade DCIS  HISTORY OF PRESENTING ILLNESS:  Victoria Hammond is a  59 y.o.  female with PMH listed below who was referred to me for evaluation of  Patient had screening mammogram on 11/22/2017 which showed suspicious calcifications.  She underwent digital diagnostic mammogram on 12/05/2017 which confirmed grouped fine pleomorphic calcifications within the upper outer quadrant of the left breast.  Spanning 8 mm.  Patient underwent stereotactic biopsy of left breast calcification. Biopsy pathology showed: High-grade ductal carcinoma in situ, comedo type with associated calcifications.  Nipple discharge: Denies Family history: Maternal grandmother deceased from lung and the brain cancer, maternal uncle deceased from lung cancer.  Maternal uncle deceased from esophageal cancer.  Another maternal uncle deceased from liver cancer.  Negative for breast cancer family history.  Patient declined genetic counseling  OCP use: used OCP between age of [age].  Estrogen and progesterone therapy: denies History of radiation to chest: denies.  Previous breast surgery: History of breast cyst aspiration.  01/11/2018 left breast lumpectomy by Dr. Lysle Pearl.  Pathology showed high-grade DCIS, comedo type with associated calcifications. Size of DCIS at least 4 mm, grade 3, comedo necrosis, margins not involved by DCIS, distance from closest margin 8 mm. pTis pNx Family history of ovarian cancer, lung cancer, esophageal cancer, liver cancer, patient declined genetic counseling.  # finished adjuvant radiation on 12/31/ 2019 #Started on tamoxifen 20 mg daily in January 2020.  INTERVAL HISTORY Victoria Hammond is a 59 y.o. 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 S/p lumpectomy and radiation.  Patient has been on tamoxifen 20 mg daily.  She reports tolerating well.  Denies concerns of her breast today.  No new complaints.  Review of Systems  Constitutional: Negative for chills, fever, malaise/fatigue and weight loss.  HENT: Negative for nosebleeds and sore throat.   Eyes: Negative for double vision, photophobia and redness.  Respiratory: Negative for cough, shortness of breath and wheezing.   Cardiovascular: Negative for chest pain, palpitations, orthopnea and leg swelling.  Gastrointestinal: Negative for abdominal pain, blood in stool, nausea and vomiting.  Genitourinary: Negative for dysuria.  Musculoskeletal: Negative for back pain, myalgias and neck pain.  Skin: Negative for itching and rash.  Neurological: Negative for dizziness, tingling and tremors.  Endo/Heme/Allergies: Negative for environmental allergies. Does not bruise/bleed easily.  Psychiatric/Behavioral: Negative for depression and hallucinations. The patient is not nervous/anxious.     MEDICAL HISTORY:  Past Medical History:  Diagnosis Date  . 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 Hashimoto's thyroiditis   . Hypothyroidism   . Personal history of radiation therapy 2019  . Vitamin D deficiency     SURGICAL HISTORY: Past Surgical History:  Procedure Laterality Date  . BREAST BIOPSY Left 12/13/2017   affirm stereo biopsy/HIGH-GRADE DUCTAL CARCINOMA IN SITU, COMEDO TYPE   . BREAST CYST ASPIRATION Left 02/16/2005  . BREAST LUMPECTOMY Left 01/11/2018   HIGH-GRADE DUCTAL CARCINOMA IN SITU, COMEDO TYPE   . CERVICAL POLYPECTOMY  03/28/2006  . COLONOSCOPY    . PARTIAL MASTECTOMY WITH NEEDLE LOCALIZATION Left 01/11/2018   Procedure: PARTIAL MASTECTOMY WITH NEEDLE LOCALIZATION;  Surgeon: Benjamine Sprague,  DO;  Location: [facility] ORS;  Service:  General;  Laterality: Left;    SOCIAL HISTORY: Social History   Socioeconomic History  . Marital status: Married    Spouse name: Not on file  . Number of children: Not on file  . Years of education: Not on file  . Highest education level: Not on file  Occupational History  . Not on file  Tobacco Use  . Smoking status: Former Smoker    Packs/day: 1.00    Years: 8.00    Pack years: 8.00    Quit date: 01/03/1988    Years since quitting: 31.4  . Smokeless tobacco: Never Used  Substance and Sexual Activity  . Alcohol use: Never  . Drug use: Never  . Sexual activity: Not on file  Other Topics Concern  . Not on file  Social History Narrative  . Not on file   Social Determinants of Health   Financial Resource Strain:   . Difficulty of Paying Living Expenses: Not on file  Food Insecurity:   . Worried About Charity fundraiser in the Last Year: Not on file  . Ran Out of Food in the Last Year: Not on file  Transportation Needs:   . Lack of Transportation (Medical): Not on file  . Lack of Transportation (Non-Medical): Not on file  Physical Activity:   . Days of Exercise per Week: Not on file  . Minutes of Exercise per Session: Not on file  Stress:   . Feeling of Stress : Not on file  Social Connections:   . Frequency of Communication with Friends and Family: Not on file  . Frequency of Social Gatherings with Friends and Family: Not on file  . Attends Religious Services: Not on file  . Active Member of Clubs or Organizations: Not on file  . Attends Archivist Meetings: Not on file  . Marital Status: Not on file  Intimate Partner Violence:   . Fear of Current or Ex-Partner: Not on file  . Emotionally Abused: Not on file  . Physically Abused: Not on file  . Sexually Abused: Not on file    FAMILY HISTORY: Family History  Problem Relation Age of Onset  . Hypertension Mother   . Congestive Heart Failure Father   . Lung cancer Maternal Uncle   . Ovarian cancer  Maternal Grandmother        ovarian cancer mets to lung and brain  . Esophageal cancer Maternal Uncle   . Liver cancer Maternal Uncle   . Breast cancer Neg Hx     ALLERGIES:  is allergic to sulfa antibiotics and chlorhexidine gluconate.  MEDICATIONS:  Current Outpatient Medications  Medication Sig Dispense Refill  . ALPRAZolam (XANAX) 0.5 MG tablet Take 0.25 mg by mouth at bedtime as needed for sleep.     . cetirizine (ZYRTEC) 10 MG tablet Take by mouth.    . Cholecalciferol (VITAMIN D3) 2000 units capsule Take 2,000 Units by mouth every evening.     . etodolac (LODINE) 400 MG tablet Take 400 mg by mouth every evening.   3  . levothyroxine (SYNTHROID, LEVOTHROID) 75 MCG tablet Take 75 mcg by mouth daily before breakfast.     . magnesium oxide (MAG-OX) 400 MG tablet Take by mouth.    . Multiple Vitamins-Minerals (CENTRUM SILVER PO) Take 1 tablet by mouth daily.    . tamoxifen (NOLVADEX) 20 MG tablet TAKE 1 TABLET BY MOUTH EVERY DAY 90 tablet 1  . vitamin B-12 (CYANOCOBALAMIN)  1000 MCG tablet Take 1,000 mcg by mouth every evening.     . Vitamin E 400 units TABS Take 1 tablet by mouth daily.     No current facility-administered medications for this visit.     PHYSICAL EXAMINATION: ECOG PERFORMANCE STATUS: 0 - Asymptomatic Vitals:   06/13/19 1340  BP: (!) 142/79  Pulse: 83  Resp: 16  Temp: (!) 97.1 F (36.2 C)  SpO2: 100%   Filed Weights   06/13/19 1340  Weight: 170 lb 14.4 oz (77.5 kg)    Physical Exam Constitutional:      General: She is not in acute distress. HENT:     Head: Normocephalic and atraumatic.  Eyes:     General: No scleral icterus.    Pupils: Pupils are equal, round, and reactive to light.  Cardiovascular:     Rate and Rhythm: Normal rate and regular rhythm.     Heart sounds: Normal heart sounds.  Pulmonary:     Effort: Pulmonary effort is normal. No respiratory distress.     Breath sounds: No wheezing.  Abdominal:     General: Bowel sounds are  normal. There is no distension.     Palpations: Abdomen is soft. There is no mass.     Tenderness: There is no abdominal tenderness.  Musculoskeletal:        General: No deformity. Normal range of motion.     Cervical back: Normal range of motion and neck supple.  Skin:    General: Skin is warm and dry.     Findings: No erythema or rash.  Neurological:     Mental Status: She is alert and oriented to person, place, and time. Mental status is at baseline.     Cranial Nerves: No cranial nerve deficit.     Coordination: Coordination normal.  Psychiatric:        Mood and Affect: Mood normal.        Behavior: Behavior normal.        Thought Content: Thought content normal.   Breast exam was performed in seated and lying down position. Status post left breast lumpectomy.  No palpable breast mass or lymphadenopathy. No skin fluctuation or erythema.       LABORATORY DATA:  I have reviewed the data as listed Lab Results  Component Value Date   WBC 6.3 06/13/2019   HGB 13.8 06/13/2019   HCT 41.5 06/13/2019   MCV 88.9 06/13/2019   PLT 210 06/13/2019   Recent Labs    08/23/18 1000 12/13/18 1345 06/13/19 1314  NA 141 142 142  K 5.0 4.3 4.2  CL 108 110 108  CO2 28 25 25   GLUCOSE 109* 113* 87  BUN 16 12 17   CREATININE 0.73 0.73 0.55  CALCIUM 9.1 9.0 9.1  GFRNONAA >60 >60 >60  GFRAA >60 >60 >60  PROT 7.4 6.9 7.2  ALBUMIN 3.9 3.9 3.9  AST 19 22 17   ALT 17 24 18   ALKPHOS 61 56 51  BILITOT 0.6 0.4 0.4   Iron/TIBC/Ferritin/ %Sat No results found for: IRON, TIBC, FERRITIN, IRONPCTSAT   I have reviewed patient's recent labs done at [facility] clinic.  09/06/2017 Cbc hemoglobin 14.8, hematocrit 43.7, platelet counts 227, WBC 5.7.  Normal differential. Normal creatinine and bilirubin level.  ASSESSMENT & PLAN:  1. Ductal carcinoma in situ (DCIS) of left breast   2. Long-term current use of tamoxifen    ER weakly positive, she tolerates tamoxifen. Magnitude of preventive  effects from tamoxifen in weakly  ER positive DCIS may be lower. Given that she is tolerating well, with no significant side effects, shared decision was made to continue with tamoxifen treatments. She will need follow-ups with gynecologist for pelvic exams. We discussed about thrombogenic side effects of tamoxifen and discussed about prophylactic use of aspirin 81 mg. Recommend annual diagnostic mammogram bilaterally. Due in August 2021.  Patient gets mammogram ordered.  Gynecologist office. Follow-up in 6 months.      Earlie Server, MD, PhD Hematology Oncology [facility] at [facility] Pager- [phone]

## 2019-07-19 ENCOUNTER — Other Ambulatory Visit: Payer: Self-pay | Admitting: Oncology

## 2019-09-28 IMAGING — MG MM BREAST SURGICAL SPECIMEN
1 series · 1 of 1 positions shown · non-contrast
Comparison: Previous exam(s).

CLINICAL DATA: 56-year-old female status post left lumpectomy.

EXAM:
SPECIMEN RADIOGRAPH OF THE LEFT BREAST

[L SPECIMEN]
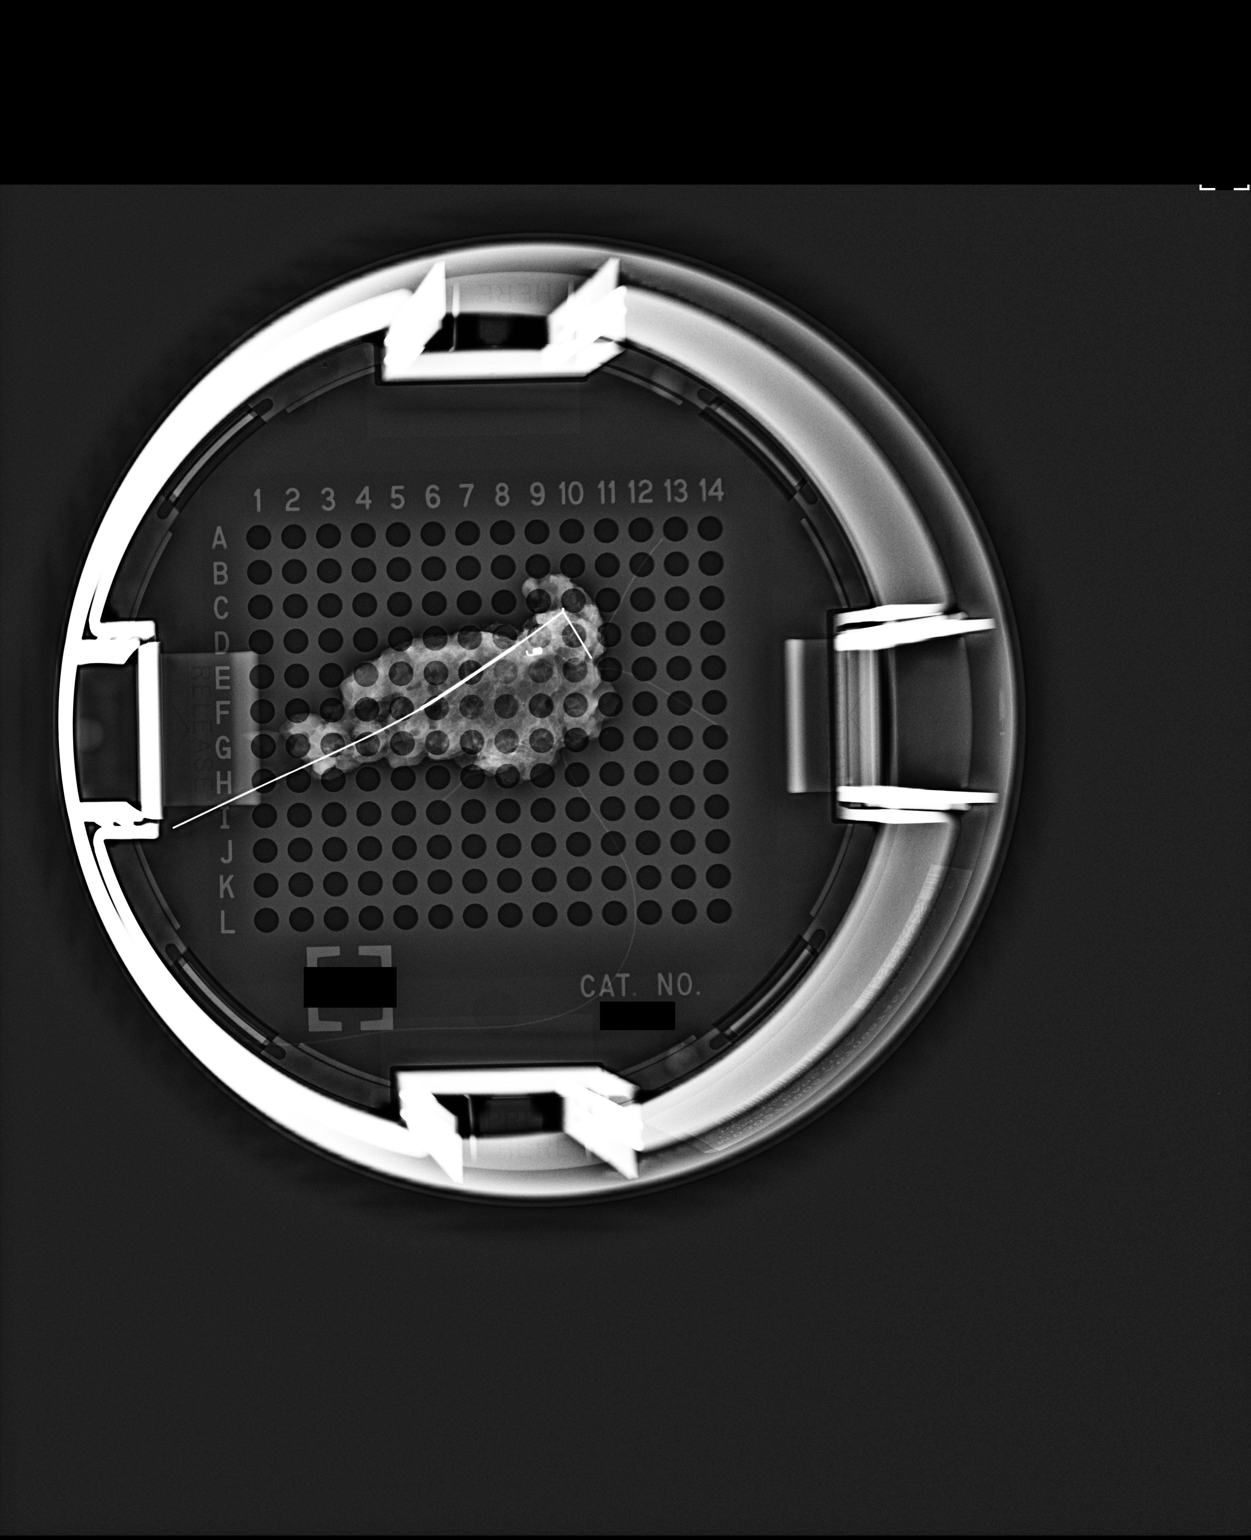

[1 of 1 positions shown; findings below may reference images not displayed]

FINDINGS: Status post excision of the left breast. The wire tip and biopsy
marker clip are present and are marked for pathology.
IMPRESSION: Specimen radiograph of the left breast.

## 2019-09-28 IMAGING — MG MM PLC BREAST LOC DEV 1ST LESION INC MAMMO GUIDE*L*
4 series · 4 of 4 positions shown · non-contrast
Comparison: Previous exams.

CLINICAL DATA: 56-year-old female presenting for needle
localization prior to left breast lumpectomy.

EXAM:
NEEDLE LOCALIZATION OF THE LEFT BREAST WITH MAMMO GUIDANCE

[L ML (1 of 2)]
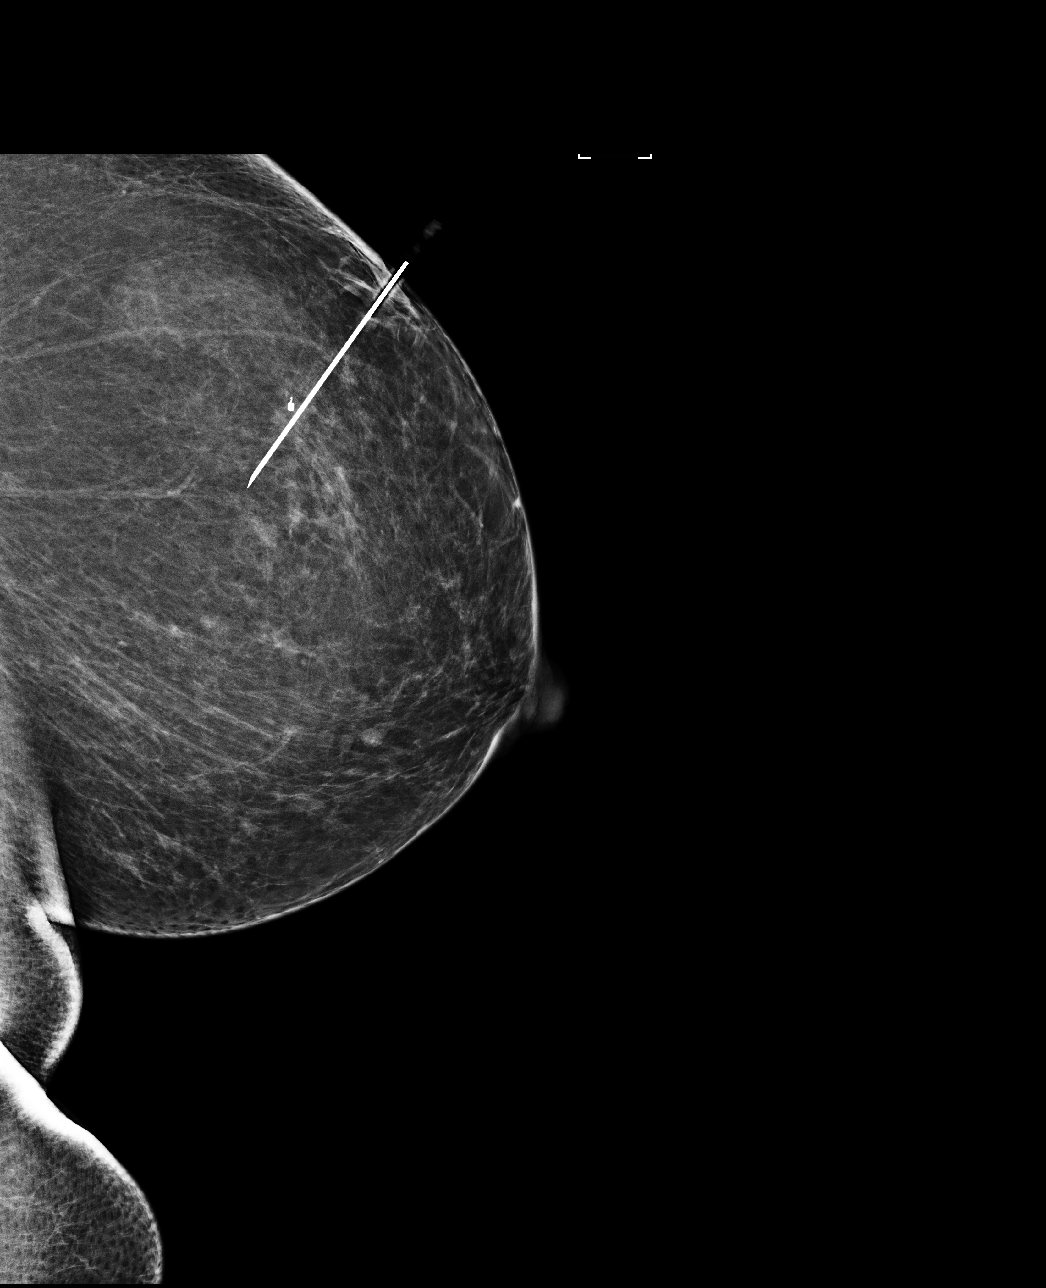

[L ML (2 of 2)]
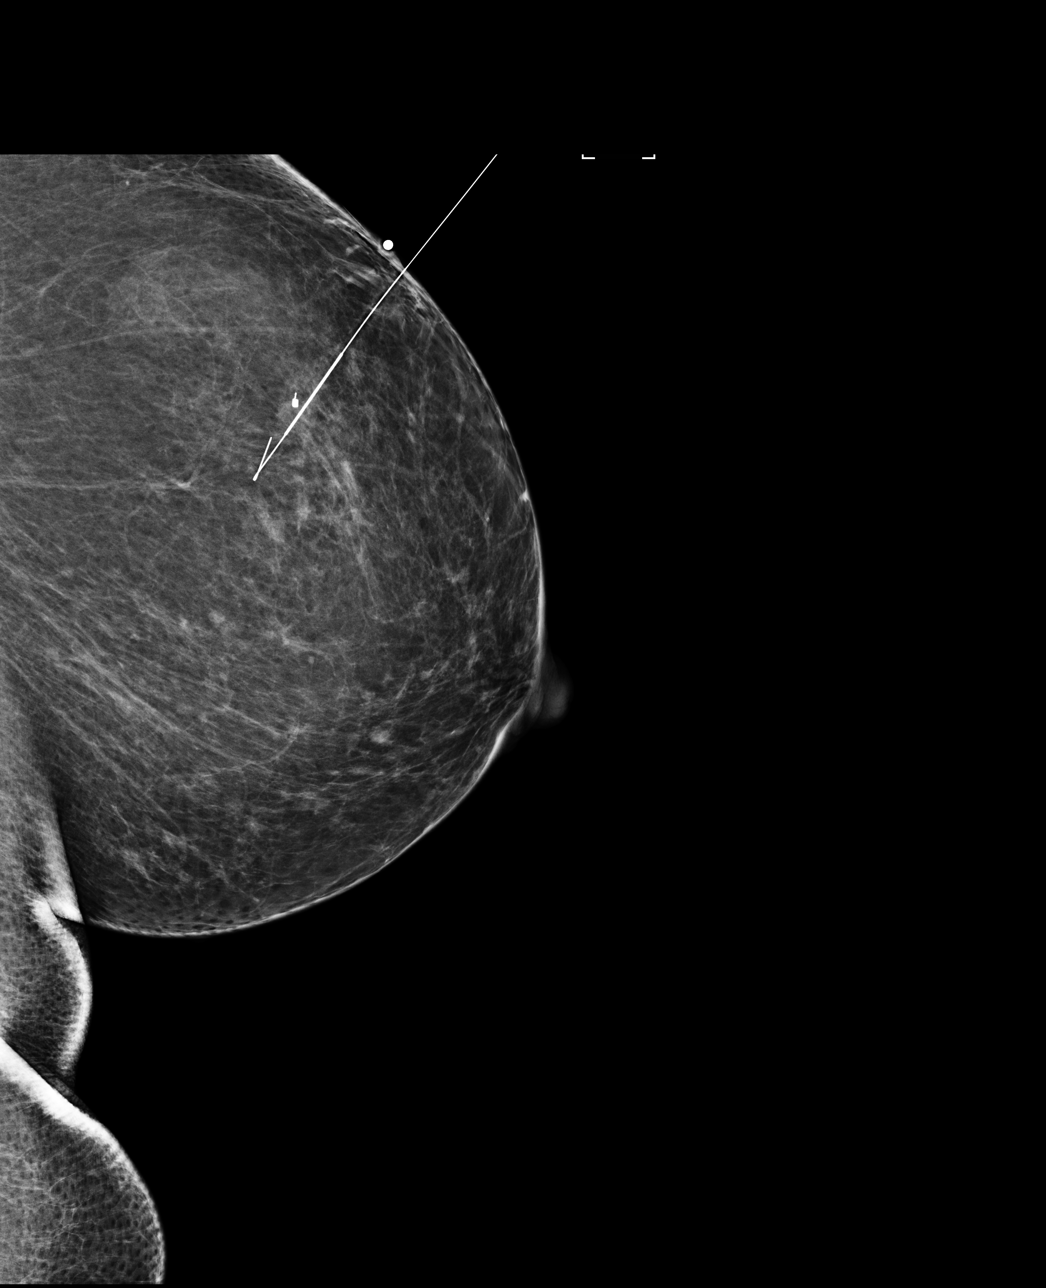

[L CC (1 of 2)]
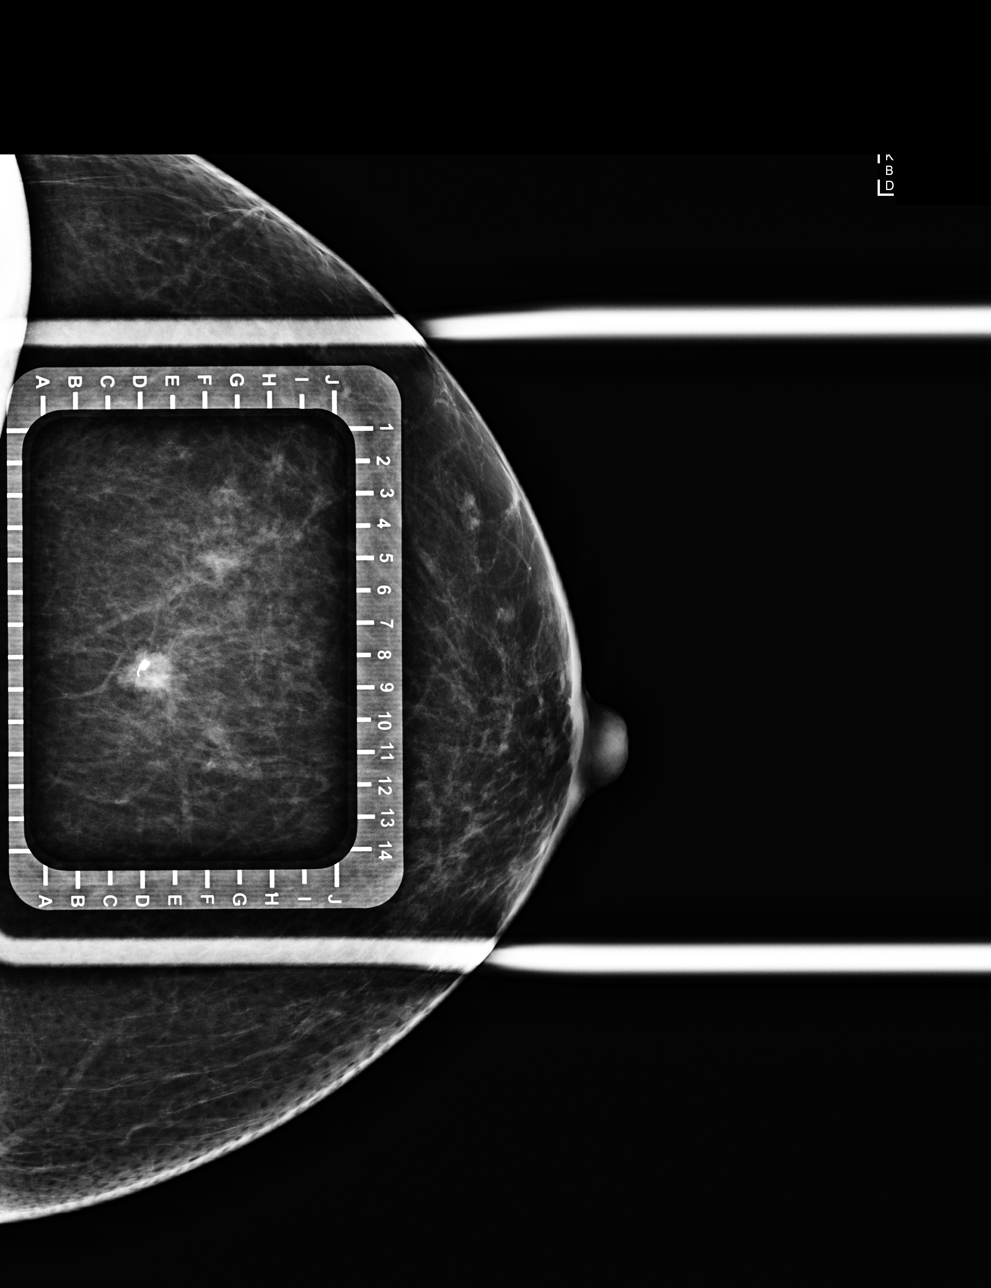

[L CC (2 of 2)]
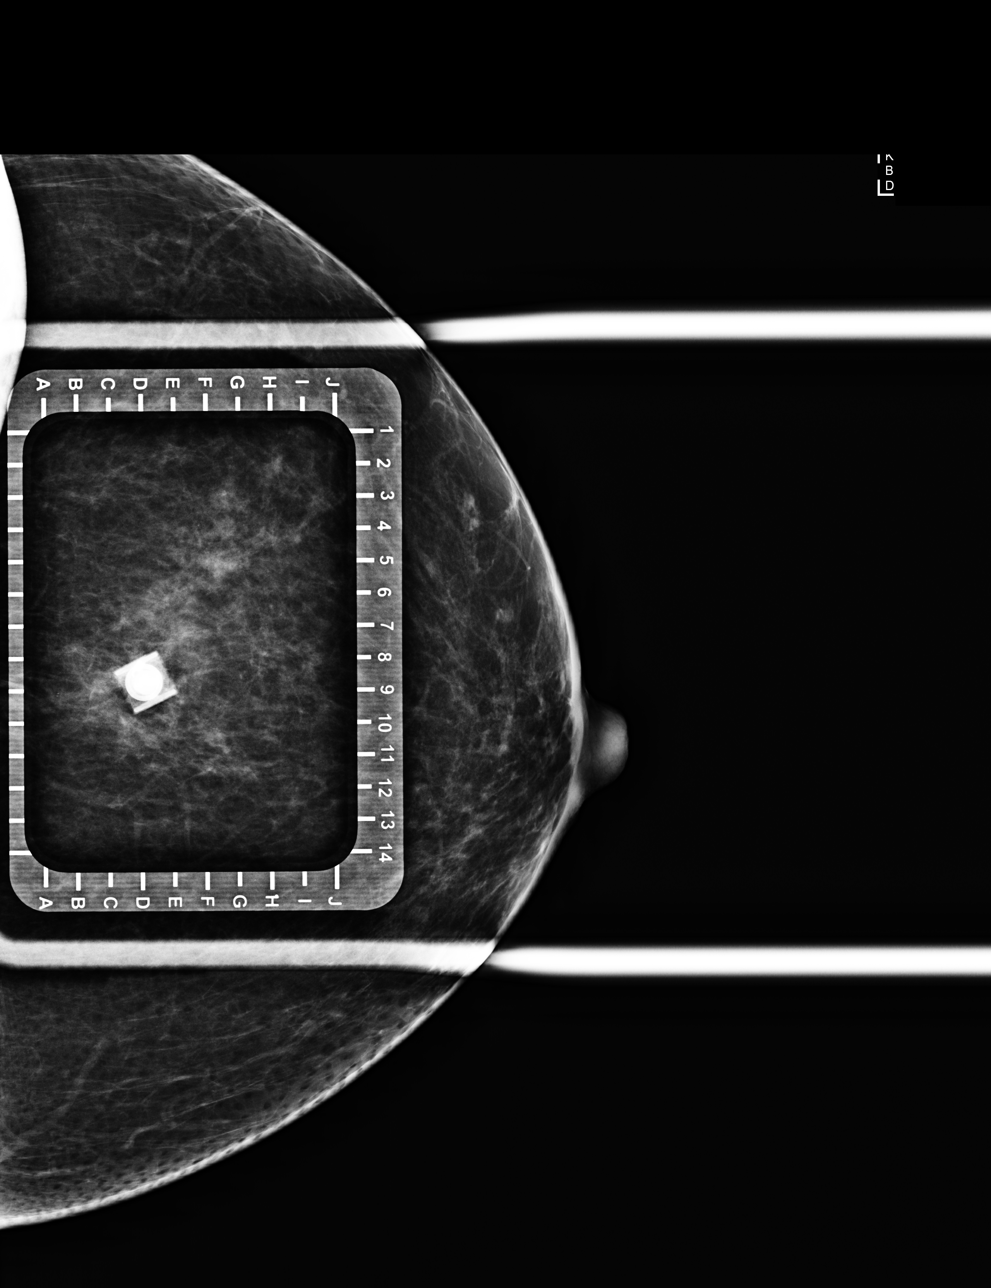

[4 of 4 positions shown; findings below may reference images not displayed]

FINDINGS: Patient presents for needle localization prior to left breast
lumpectomy. I met with the patient and we discussed the procedure of
needle localization including benefits and alternatives. We
discussed the high likelihood of a successful procedure. We
discussed the risks of the procedure, including infection, bleeding,
tissue injury, and further surgery. Informed, written consent was
given. The usual time-out protocol was performed immediately prior
to the procedure.

Using mammographic guidance, sterile technique, 1% lidocaine and a 5
cm modified Kopans needle, coil shaped clip localized using superior
approach. The images were marked for Dr. Dede.
IMPRESSION: Needle localization left breast. No apparent complications.

## 2019-11-07 ENCOUNTER — Other Ambulatory Visit: Payer: Self-pay | Admitting: Obstetrics and Gynecology

## 2019-11-07 DIAGNOSIS — Z853 Personal history of malignant neoplasm of breast: Secondary | ICD-10-CM

## 2019-11-28 ENCOUNTER — Ambulatory Visit
Admission: RE | Admit: 2019-11-28 | Discharge: 2019-11-28 | Disposition: A | Discharge status: Home / Self Care | Payer: BC Managed Care – PPO | Source: Ambulatory Visit | Attending: Obstetrics and Gynecology | Admitting: Obstetrics and Gynecology

## 2019-11-28 ENCOUNTER — Other Ambulatory Visit: Payer: Self-pay

## 2019-11-28 DIAGNOSIS — Z853 Personal history of malignant neoplasm of breast: Secondary | ICD-10-CM | POA: Diagnosis not present

## 2019-12-15 ENCOUNTER — Other Ambulatory Visit: Payer: Self-pay

## 2019-12-15 ENCOUNTER — Encounter: Payer: Self-pay | Admitting: Oncology

## 2019-12-15 ENCOUNTER — Inpatient Hospital Stay: Payer: BC Managed Care – PPO | Attending: Oncology

## 2019-12-15 ENCOUNTER — Inpatient Hospital Stay (HOSPITAL_BASED_OUTPATIENT_CLINIC_OR_DEPARTMENT_OTHER): Payer: BC Managed Care – PPO | Admitting: Oncology

## 2019-12-15 VITALS — BP 119/77 | HR 73 | Temp 97.3°F | Resp 16 | Wt 168.1 lb

## 2019-12-15 DIAGNOSIS — Z923 Personal history of irradiation: Secondary | ICD-10-CM | POA: Insufficient documentation

## 2019-12-15 DIAGNOSIS — D0512 Intraductal carcinoma in situ of left breast: Secondary | ICD-10-CM | POA: Diagnosis present

## 2019-12-15 DIAGNOSIS — Z170 Estrogen receptor positive status [ER+]: Secondary | ICD-10-CM | POA: Insufficient documentation

## 2019-12-15 DIAGNOSIS — Z79810 Long term (current) use of selective estrogen receptor modulators (SERMs): Secondary | ICD-10-CM | POA: Diagnosis not present

## 2019-12-15 LAB — COMPREHENSIVE METABOLIC PANEL
ALT: 18 U/L (ref 0–44)
AST: 19 U/L (ref 15–41)
Albumin: 4 g/dL (ref 3.5–5.0)
Alkaline Phosphatase: 47 U/L (ref 38–126)
Anion gap: 6 (ref 5–15)
BUN: 16 mg/dL (ref 6–20)
CO2: 27 mmol/L (ref 22–32)
Calcium: 9.1 mg/dL (ref 8.9–10.3)
Chloride: 107 mmol/L (ref 98–111)
Creatinine, Ser: 0.81 mg/dL (ref 0.44–1.00)
GFR calc Af Amer: >60 mL/min (ref >60)
GFR calc non Af Amer: >60 mL/min (ref >60)
Glucose, Bld: 112 mg/dL — ABNORMAL HIGH (ref 70–99)
Potassium: 5.2 mmol/L — ABNORMAL HIGH (ref 3.5–5.1)
Sodium: 140 mmol/L (ref 135–145)
Total Bilirubin: 0.5 mg/dL (ref 0.3–1.2)
Total Protein: 7.4 g/dL (ref 6.5–8.1)

## 2019-12-15 LAB — CBC WITH DIFFERENTIAL/PLATELET
Abs Immature Granulocytes: 0.02 10*3/uL (ref 0.00–0.07)
Basophils Absolute: 0 10*3/uL (ref 0.0–0.1)
Basophils Relative: 0 %
Eosinophils Absolute: 0.1 10*3/uL (ref 0.0–0.5)
Eosinophils Relative: 2 %
HCT: 41.3 % (ref 36.0–46.0)
Hemoglobin: 14.4 g/dL (ref 12.0–15.0)
Immature Granulocytes: 0 %
Lymphocytes Relative: 24 %
Lymphs Abs: 1.6 10*3/uL (ref 0.7–4.0)
MCH: 29.8 pg (ref 26.0–34.0)
MCHC: 34.9 g/dL (ref 30.0–36.0)
MCV: 85.3 fL (ref 80.0–100.0)
Monocytes Absolute: 0.3 10*3/uL (ref 0.1–1.0)
Monocytes Relative: 5 %
Neutro Abs: 4.6 10*3/uL (ref 1.7–7.7)
Neutrophils Relative %: 69 %
Platelets: 219 10*3/uL (ref 150–400)
RBC: 4.84 MIL/uL (ref 3.87–5.11)
RDW: 12.4 % (ref 11.5–15.5)
WBC: 6.7 10*3/uL (ref 4.0–10.5)
nRBC: 0 % (ref 0.0–0.2)

## 2019-12-15 MED ORDER — TAMOXIFEN CITRATE 20 MG PO TABS
20.0000 mg | ORAL_TABLET | Freq: Every day | ORAL | 1 refills | Status: DC
Start: 2019-12-15 — End: 2020-06-14

## 2019-12-15 NOTE — Progress Notes (Signed)
- Hematology/Oncology follow up  note [facility] Telephone:(336) [phone] Fax:(336) [phone]   Patient Care Team: Rusty Aus, MD as PCP - General (Internal Medicine) Surgeon: Dr.Sakai  REASON FOR VISIT Follow up for treatment of high grade DCIS  HISTORY OF PRESENTING ILLNESS:  Victoria Hammond is a  59 y.o.  female with PMH listed below who was referred to me for evaluation of  Patient had screening mammogram on 11/22/2017 which showed suspicious calcifications.  She underwent digital diagnostic mammogram on 12/05/2017 which confirmed grouped fine pleomorphic calcifications within the upper outer quadrant of the left breast.  Spanning 8 mm.  Patient underwent stereotactic biopsy of left breast calcification. Biopsy pathology showed: High-grade ductal carcinoma in situ, comedo type with associated calcifications.  Nipple discharge: Denies Family history: Maternal grandmother deceased from lung and the brain cancer, maternal uncle deceased from lung cancer.  Maternal uncle deceased from esophageal cancer.  Another maternal uncle deceased from liver cancer.  Negative for breast cancer family history.  Patient declined genetic counseling  OCP use: used OCP between age of [age].  Estrogen and progesterone therapy: denies History of radiation to chest: denies.  Previous breast surgery: History of breast cyst aspiration.  01/11/2018 left breast lumpectomy by Dr. Lysle Pearl.  Pathology showed high-grade DCIS, comedo type with associated calcifications. Size of DCIS at least 4 mm, grade 3, comedo necrosis, margins not involved by DCIS, distance from closest margin 8 mm. pTis pNx Family history of ovarian cancer, lung cancer, esophageal cancer, liver cancer, patient declined genetic counseling.  # finished adjuvant radiation on 12/31/ 2019 #Started on tamoxifen 20 mg daily in January 2020.  INTERVAL HISTORY Victoria Hammond is a 59 y.o. 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 S/p lumpectomy and radiation.  Patient is on tamoxifen 20 mg daily. She reports tolerating well. Denies any concerns of her breast today. No new complaints. She recently had diagnostic mammogram. .  Review of Systems  Constitutional: Negative for chills, fever, malaise/fatigue and weight loss.  HENT: Negative for nosebleeds and sore throat.   Eyes: Negative for double vision, photophobia and redness.  Respiratory: Negative for cough, shortness of breath and wheezing.   Cardiovascular: Negative for chest pain, palpitations, orthopnea and leg swelling.  Gastrointestinal: Negative for abdominal pain, blood in stool, nausea and vomiting.  Genitourinary: Negative for dysuria.  Musculoskeletal: Negative for back pain, myalgias and neck pain.  Skin: Negative for itching and rash.  Neurological: Negative for dizziness, tingling and tremors.  Endo/Heme/Allergies: Negative for environmental allergies. Does not bruise/bleed easily.  Psychiatric/Behavioral: Negative for depression and hallucinations. The patient is not nervous/anxious.     MEDICAL HISTORY:  Past Medical History:  Diagnosis Date  . 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 Hashimoto's thyroiditis   . Hypothyroidism   . Personal history of radiation therapy 2019  . Vitamin D deficiency     SURGICAL HISTORY: Past Surgical History:  Procedure Laterality Date  . BREAST BIOPSY Left 12/13/2017   affirm stereo biopsy/HIGH-GRADE DUCTAL CARCINOMA IN SITU, COMEDO TYPE   . BREAST CYST ASPIRATION Left 02/16/2005  . BREAST LUMPECTOMY Left 01/11/2018   HIGH-GRADE DUCTAL CARCINOMA IN SITU, COMEDO TYPE   . CERVICAL POLYPECTOMY  03/28/2006  . COLONOSCOPY    . PARTIAL MASTECTOMY WITH NEEDLE LOCALIZATION Left 01/11/2018   Procedure: PARTIAL MASTECTOMY WITH NEEDLE LOCALIZATION;  Surgeon: Benjamine Sprague,  DO;  Location: [facility] ORS;  Service: General;  Laterality: Left;    SOCIAL HISTORY: Social History   Socioeconomic History  . Marital status: Single    Spouse name: Not on file  . Number of children: Not on file  . Years of education: Not on file  . Highest education level: Not on file  Occupational History  . Not on file  Tobacco Use  . Smoking status: Former Smoker    Packs/day: 1.00    Years: 8.00    Pack years: 8.00    Quit date: 01/03/1988    Years since quitting: 31.9  . Smokeless tobacco: Never Used  Vaping Use  . Vaping Use: Never used  Substance and Sexual Activity  . Alcohol use: Never  . Drug use: Never  . Sexual activity: Not on file  Other Topics Concern  . Not on file  Social History Narrative  . Not on file   Social Determinants of Health   Financial Resource Strain:   . Difficulty of Paying Living Expenses: Not on file  Food Insecurity:   . Worried About Charity fundraiser in the Last Year: Not on file  . Ran Out of Food in the Last Year: Not on file  Transportation Needs:   . Lack of Transportation (Medical): Not on file  . Lack of Transportation (Non-Medical): Not on file  Physical Activity:   . Days of Exercise per Week: Not on file  . Minutes of Exercise per Session: Not on file  Stress:   . Feeling of Stress : Not on file  Social Connections:   . Frequency of Communication with Friends and Family: Not on file  . Frequency of Social Gatherings with Friends and Family: Not on file  . Attends Religious Services: Not on file  . Active Member of Clubs or Organizations: Not on file  . Attends Archivist Meetings: Not on file  . Marital Status: Not on file  Intimate Partner Violence:   . Fear of Current or Ex-Partner: Not on file  . Emotionally Abused: Not on file  . Physically Abused: Not on file  . Sexually Abused: Not on file    FAMILY HISTORY: Family History  Problem Relation Age of Onset  . Hypertension Mother   . Congestive  Heart Failure Father   . Lung cancer Maternal Uncle   . Ovarian cancer Maternal Grandmother        ovarian cancer mets to lung and brain  . Esophageal cancer Maternal Uncle   . Liver cancer Maternal Uncle   . Breast cancer Neg Hx     ALLERGIES:  is allergic to sulfa antibiotics and chlorhexidine gluconate.  MEDICATIONS:  Current Outpatient Medications  Medication Sig Dispense Refill  . ALPRAZolam (XANAX) 0.5 MG tablet Take 0.25 mg by mouth at bedtime as needed for sleep.     . cetirizine (ZYRTEC) 10 MG tablet Take by mouth.    . Cholecalciferol (VITAMIN D3) 2000 units capsule Take 2,000 Units by mouth every evening.     . etodolac (LODINE) 400 MG tablet Take 400 mg by mouth every evening.   3  . levothyroxine (SYNTHROID, LEVOTHROID) 75 MCG tablet Take 75 mcg by mouth daily before breakfast.     . magnesium oxide (MAG-OX) 400 MG tablet Take by mouth.    Marland Kitchen omeprazole (PRILOSEC) 40 MG capsule Take by mouth.    . tamoxifen (NOLVADEX) 20 MG tablet Take 1 tablet (20 mg total)  by mouth daily. 90 tablet 1  . vitamin B-12 (CYANOCOBALAMIN) 1000 MCG tablet Take 1,000 mcg by mouth every evening.     . Vitamin E 400 units TABS Take 1 tablet by mouth daily.     No current facility-administered medications for this visit.     PHYSICAL EXAMINATION: ECOG PERFORMANCE STATUS: 0 - Asymptomatic Vitals:   12/15/19 1014  BP: 119/77  Pulse: 73  Resp: 16  Temp: (!) 97.3 F (36.3 C)   Filed Weights   12/15/19 1014  Weight: 168 lb 1.6 oz (76.2 kg)    Physical Exam Constitutional:      General: She is not in acute distress. HENT:     Head: Normocephalic and atraumatic.  Eyes:     General: No scleral icterus.    Pupils: Pupils are equal, round, and reactive to light.  Cardiovascular:     Rate and Rhythm: Normal rate and regular rhythm.     Heart sounds: Normal heart sounds.  Pulmonary:     Effort: Pulmonary effort is normal. No respiratory distress.     Breath sounds: No wheezing.   Abdominal:     General: Bowel sounds are normal. There is no distension.     Palpations: Abdomen is soft. There is no mass.     Tenderness: There is no abdominal tenderness.  Musculoskeletal:        General: No deformity. Normal range of motion.     Cervical back: Normal range of motion and neck supple.  Skin:    General: Skin is warm and dry.     Findings: No erythema or rash.  Neurological:     Mental Status: She is alert and oriented to person, place, and time. Mental status is at baseline.     Cranial Nerves: No cranial nerve deficit.     Coordination: Coordination normal.  Psychiatric:        Mood and Affect: Mood normal.        Behavior: Behavior normal.        Thought Content: Thought content normal.     Patient deferred breast examination today.   LABORATORY DATA:  I have reviewed the data as listed Lab Results  Component Value Date   WBC 6.7 12/15/2019   HGB 14.4 12/15/2019   HCT 41.3 12/15/2019   MCV 85.3 12/15/2019   PLT 219 12/15/2019   Recent Labs    06/13/19 1314 12/15/19 0941  NA 142 140  K 4.2 5.2*  CL 108 107  CO2 25 27  GLUCOSE 87 112*  BUN 17 16  CREATININE 0.55 0.81  CALCIUM 9.1 9.1  GFRNONAA >60 >60  GFRAA >60 >60  PROT 7.2 7.4  ALBUMIN 3.9 4.0  AST 17 19  ALT 18 18  ALKPHOS 51 47  BILITOT 0.4 0.5   Iron/TIBC/Ferritin/ %Sat No results found for: IRON, TIBC, FERRITIN, IRONPCTSAT   I have reviewed patient's recent labs done at [facility] clinic.  09/06/2017 Cbc hemoglobin 14.8, hematocrit 43.7, platelet counts 227, WBC 5.7.  Normal differential. Normal creatinine and bilirubin level.  ASSESSMENT & PLAN:  1. Ductal carcinoma in situ (DCIS) of left breast   2. Long-term current use of tamoxifen    ER weakly positive, she tolerates tamoxifen. Magnitude of preventive effects from tamoxifen in weakly ER positive DCIS may be lower. Previously discussed nature decision was made to continue tamoxifen treatments. She is postmenopausal, we  also discussed about option of using aromatase inhibitor and the patient prefers to continue tamoxifen.  She is compliant with her annual pelvic examination with gynecologist. We discussed about her thrombogenic side effects of tamoxifen and prophylactic use of aspirin 81 mg. Annual diagnostic mammogram was independently reviewed and discussed with patient. She is due for mammogram in 1 year. She gets mammogram done through gynecologist office.  Follow-up in 6 months.    Earlie Server, MD, PhD Hematology Oncology [facility] at [facility] Pager- [phone]

## 2019-12-15 NOTE — Progress Notes (Signed)
Patient denies new problems/concerns today.   °

## 2020-06-14 ENCOUNTER — Other Ambulatory Visit: Payer: Self-pay | Admitting: Oncology

## 2020-06-15 ENCOUNTER — Encounter: Payer: Self-pay | Admitting: Oncology

## 2020-06-15 ENCOUNTER — Inpatient Hospital Stay: Payer: BC Managed Care – PPO | Attending: Oncology

## 2020-06-15 ENCOUNTER — Inpatient Hospital Stay (HOSPITAL_BASED_OUTPATIENT_CLINIC_OR_DEPARTMENT_OTHER): Payer: BC Managed Care – PPO | Admitting: Oncology

## 2020-06-15 VITALS — BP 130/87 | HR 81 | Temp 97.6°F | Resp 18 | Wt 169.4 lb

## 2020-06-15 DIAGNOSIS — Z79810 Long term (current) use of selective estrogen receptor modulators (SERMs): Secondary | ICD-10-CM

## 2020-06-15 DIAGNOSIS — Z923 Personal history of irradiation: Secondary | ICD-10-CM | POA: Insufficient documentation

## 2020-06-15 DIAGNOSIS — Z7982 Long term (current) use of aspirin: Secondary | ICD-10-CM | POA: Diagnosis not present

## 2020-06-15 DIAGNOSIS — E063 Autoimmune thyroiditis: Secondary | ICD-10-CM | POA: Diagnosis not present

## 2020-06-15 DIAGNOSIS — Z800 Family history of malignant neoplasm of digestive organs: Secondary | ICD-10-CM | POA: Insufficient documentation

## 2020-06-15 DIAGNOSIS — D0512 Intraductal carcinoma in situ of left breast: Secondary | ICD-10-CM | POA: Diagnosis not present

## 2020-06-15 DIAGNOSIS — Z79899 Other long term (current) drug therapy: Secondary | ICD-10-CM | POA: Insufficient documentation

## 2020-06-15 DIAGNOSIS — Z87891 Personal history of nicotine dependence: Secondary | ICD-10-CM | POA: Diagnosis not present

## 2020-06-15 DIAGNOSIS — Z8041 Family history of malignant neoplasm of ovary: Secondary | ICD-10-CM | POA: Diagnosis not present

## 2020-06-15 DIAGNOSIS — E559 Vitamin D deficiency, unspecified: Secondary | ICD-10-CM | POA: Diagnosis not present

## 2020-06-15 DIAGNOSIS — Z8249 Family history of ischemic heart disease and other diseases of the circulatory system: Secondary | ICD-10-CM | POA: Insufficient documentation

## 2020-06-15 DIAGNOSIS — Z801 Family history of malignant neoplasm of trachea, bronchus and lung: Secondary | ICD-10-CM | POA: Insufficient documentation

## 2020-06-15 LAB — CBC WITH DIFFERENTIAL/PLATELET
Abs Immature Granulocytes: 0.02 10*3/uL (ref 0.00–0.07)
Basophils Absolute: 0 10*3/uL (ref 0.0–0.1)
Basophils Relative: 0 %
Eosinophils Absolute: 0.2 10*3/uL (ref 0.0–0.5)
Eosinophils Relative: 2 %
HCT: 43.6 % (ref 36.0–46.0)
Hemoglobin: 14.4 g/dL (ref 12.0–15.0)
Immature Granulocytes: 0 %
Lymphocytes Relative: 27 %
Lymphs Abs: 1.8 10*3/uL (ref 0.7–4.0)
MCH: 28.9 pg (ref 26.0–34.0)
MCHC: 33 g/dL (ref 30.0–36.0)
MCV: 87.6 fL (ref 80.0–100.0)
Monocytes Absolute: 0.3 10*3/uL (ref 0.1–1.0)
Monocytes Relative: 5 %
Neutro Abs: 4.4 10*3/uL (ref 1.7–7.7)
Neutrophils Relative %: 66 %
Platelets: 239 10*3/uL (ref 150–400)
RBC: 4.98 MIL/uL (ref 3.87–5.11)
RDW: 12.3 % (ref 11.5–15.5)
WBC: 6.8 10*3/uL (ref 4.0–10.5)
nRBC: 0 % (ref 0.0–0.2)

## 2020-06-15 LAB — COMPREHENSIVE METABOLIC PANEL
ALT: 18 U/L (ref 0–44)
AST: 18 U/L (ref 15–41)
Albumin: 3.8 g/dL (ref 3.5–5.0)
Alkaline Phosphatase: 49 U/L (ref 38–126)
Anion gap: 10 (ref 5–15)
BUN: 18 mg/dL (ref 6–20)
CO2: 25 mmol/L (ref 22–32)
Calcium: 9 mg/dL (ref 8.9–10.3)
Chloride: 105 mmol/L (ref 98–111)
Creatinine, Ser: 0.79 mg/dL (ref 0.44–1.00)
GFR, Estimated: >60 mL/min (ref >60)
Glucose, Bld: 123 mg/dL — ABNORMAL HIGH (ref 70–99)
Potassium: 4.6 mmol/L (ref 3.5–5.1)
Sodium: 140 mmol/L (ref 135–145)
Total Bilirubin: 0.7 mg/dL (ref 0.3–1.2)
Total Protein: 7 g/dL (ref 6.5–8.1)

## 2020-06-15 NOTE — Progress Notes (Signed)
Patient here for follow up. No new concerns voiced. No new breast problems.

## 2020-06-15 NOTE — Progress Notes (Signed)
- Hematology/Oncology follow up  note [facility] Telephone:(336) [phone] Fax:(336) [phone]   Patient Care Team: Rusty Aus, MD as PCP - General (Internal Medicine) Surgeon: Dr.Sakai  REASON FOR VISIT Follow up for treatment of high grade DCIS  HISTORY OF PRESENTING ILLNESS:  Victoria Hammond is a  60 y.o.  female with PMH listed below who was referred to me for evaluation of  Patient had screening mammogram on 11/22/2017 which showed suspicious calcifications.  She underwent digital diagnostic mammogram on 12/05/2017 which confirmed grouped fine pleomorphic calcifications within the upper outer quadrant of the left breast.  Spanning 8 mm.  Patient underwent stereotactic biopsy of left breast calcification. Biopsy pathology showed: High-grade ductal carcinoma in situ, comedo type with associated calcifications.  Nipple discharge: Denies Family history: Maternal grandmother deceased from lung and the brain cancer, maternal uncle deceased from lung cancer.  Maternal uncle deceased from esophageal cancer.  Another maternal uncle deceased from liver cancer.  Negative for breast cancer family history.  Patient declined genetic counseling  OCP use: used OCP between age of [age].  Estrogen and progesterone therapy: denies History of radiation to chest: denies.  Previous breast surgery: History of breast cyst aspiration.  01/11/2018 left breast lumpectomy by Dr. Lysle Pearl.  Pathology showed high-grade DCIS, comedo type with associated calcifications. Size of DCIS at least 4 mm, grade 3, comedo necrosis, margins not involved by DCIS, distance from closest margin 8 mm. pTis pNx Family history of ovarian cancer, lung cancer, esophageal cancer, liver cancer, patient declined genetic counseling.  # finished adjuvant radiation on 12/31/ 2019 #Started on tamoxifen 20 mg daily in January 2020.  INTERVAL HISTORY Victoria Hammond is a 60 y.o. 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 S/p lumpectomy and radiation.  Patient is on tamoxifen 20 mg daily.  Patient reports tolerating well. She gets mammogram ordered through gynecologist office..  11/28/2019, bilateral diagnostic mammogram showed no evidence of malignancy in either breast.  Review of Systems  Constitutional: Negative for chills, fever, malaise/fatigue and weight loss.  HENT: Negative for nosebleeds and sore throat.   Eyes: Negative for double vision, photophobia and redness.  Respiratory: Negative for cough, shortness of breath and wheezing.   Cardiovascular: Negative for chest pain, palpitations, orthopnea and leg swelling.  Gastrointestinal: Negative for abdominal pain, blood in stool, nausea and vomiting.  Genitourinary: Negative for dysuria.  Musculoskeletal: Negative for back pain, myalgias and neck pain.  Skin: Negative for itching and rash.  Neurological: Negative for dizziness, tingling and tremors.  Endo/Heme/Allergies: Negative for environmental allergies. Does not bruise/bleed easily.  Psychiatric/Behavioral: Negative for depression and hallucinations. The patient is not nervous/anxious.     MEDICAL HISTORY:  Past Medical History:  Diagnosis Date  . 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 Hashimoto's thyroiditis   . Hypothyroidism   . Personal history of radiation therapy 2019  . Vitamin D deficiency     SURGICAL HISTORY: Past Surgical History:  Procedure Laterality Date  . BREAST BIOPSY Left 12/13/2017   affirm stereo biopsy/HIGH-GRADE DUCTAL CARCINOMA IN SITU, COMEDO TYPE   . BREAST CYST ASPIRATION Left 02/16/2005  . BREAST LUMPECTOMY Left 01/11/2018   HIGH-GRADE DUCTAL CARCINOMA IN SITU, COMEDO TYPE   . CERVICAL POLYPECTOMY  03/28/2006  . COLONOSCOPY    . PARTIAL MASTECTOMY WITH NEEDLE LOCALIZATION Left 01/11/2018   Procedure: PARTIAL  MASTECTOMY WITH NEEDLE LOCALIZATION;  Surgeon: Benjamine Sprague, DO;  Location: [facility] ORS;  Service: General;  Laterality: Left;    SOCIAL HISTORY: Social History   Socioeconomic History  . Marital status: Single    Spouse name: Not on file  . Number of children: Not on file  . Years of education: Not on file  . Highest education level: Not on file  Occupational History  . Not on file  Tobacco Use  . Smoking status: Former Smoker    Packs/day: 1.00    Years: 8.00    Pack years: 8.00    Quit date: 01/03/1988    Years since quitting: 32.4  . Smokeless tobacco: Never Used  Vaping Use  . Vaping Use: Never used  Substance and Sexual Activity  . Alcohol use: Never  . Drug use: Never  . Sexual activity: Not on file  Other Topics Concern  . Not on file  Social History Narrative  . Not on file   Social Determinants of Health   Financial Resource Strain: Not on file  Food Insecurity: Not on file  Transportation Needs: Not on file  Physical Activity: Not on file  Stress: Not on file  Social Connections: Not on file  Intimate Partner Violence: Not on file    FAMILY HISTORY: Family History  Problem Relation Age of Onset  . Hypertension Mother   . Congestive Heart Failure Father   . Lung cancer Maternal Uncle   . Ovarian cancer Maternal Grandmother        ovarian cancer mets to lung and brain  . Esophageal cancer Maternal Uncle   . Liver cancer Maternal Uncle   . Breast cancer Neg Hx     ALLERGIES:  is allergic to sulfa antibiotics and chlorhexidine gluconate.  MEDICATIONS:  Current Outpatient Medications  Medication Sig Dispense Refill  . ALPRAZolam (XANAX) 0.5 MG tablet Take 0.25 mg by mouth at bedtime as needed for sleep.     Marland Kitchen aspirin 81 MG chewable tablet Chew 81 mg by mouth daily.    . cetirizine (ZYRTEC) 10 MG tablet Take 10 mg by mouth.    . Cholecalciferol (VITAMIN D3) 2000 units capsule Take 2,000 Units by mouth every evening.     . etodolac (LODINE) 400 MG  tablet Take 400 mg by mouth every evening.   3  . levothyroxine (SYNTHROID, LEVOTHROID) 75 MCG tablet Take 75 mcg by mouth daily before breakfast.     . magnesium oxide (MAG-OX) 400 MG tablet Take by mouth.    . tamoxifen (NOLVADEX) 20 MG tablet TAKE 1 TABLET BY MOUTH EVERY DAY 90 tablet 1  . vitamin B-12 (CYANOCOBALAMIN) 1000 MCG tablet Take 1,000 mcg by mouth every evening.     . Vitamin E 400 units TABS Take 1 tablet by mouth daily.    Marland Kitchen omeprazole (PRILOSEC) 40 MG capsule Take by mouth. (Patient not taking: Reported on 06/15/2020)     No current facility-administered medications for this visit.     PHYSICAL EXAMINATION: ECOG PERFORMANCE STATUS: 0 - Asymptomatic Vitals:   06/15/20 0946  BP: 130/87  Pulse: 81  Resp: 18  Temp: 97.6 F (36.4 C)   Filed Weights   06/15/20 0946  Weight: 169 lb 6.4 oz (76.8 kg)    Physical Exam Constitutional:      General: She is not in acute distress. HENT:     Head: Normocephalic and atraumatic.  Eyes:     General: No scleral icterus.    Pupils: Pupils  are equal, round, and reactive to light.  Cardiovascular:     Rate and Rhythm: Normal rate and regular rhythm.     Heart sounds: Normal heart sounds.  Pulmonary:     Effort: Pulmonary effort is normal. No respiratory distress.     Breath sounds: No wheezing.  Abdominal:     General: Bowel sounds are normal. There is no distension.     Palpations: Abdomen is soft. There is no mass.     Tenderness: There is no abdominal tenderness.  Musculoskeletal:        General: No deformity. Normal range of motion.     Cervical back: Normal range of motion and neck supple.  Skin:    General: Skin is warm and dry.     Findings: No erythema or rash.  Neurological:     Mental Status: She is alert and oriented to person, place, and time. Mental status is at baseline.     Cranial Nerves: No cranial nerve deficit.     Coordination: Coordination normal.  Psychiatric:        Mood and Affect: Mood normal.         Behavior: Behavior normal.        Thought Content: Thought content normal.   Breast exam was performed in seated and lying down position. Status post left breast lumpectomy.  No palpable breast mass or lymphadenopathy. No skin fluctuation or erythema.     LABORATORY DATA:  I have reviewed the data as listed Lab Results  Component Value Date   WBC 6.8 06/15/2020   HGB 14.4 06/15/2020   HCT 43.6 06/15/2020   MCV 87.6 06/15/2020   PLT 239 06/15/2020   Recent Labs    12/15/19 0941 06/15/20 0930  NA 140 140  K 5.2* 4.6  CL 107 105  CO2 27 25  GLUCOSE 112* 123*  BUN 16 18  CREATININE 0.81 0.79  CALCIUM 9.1 9.0  GFRNONAA >60 >60  GFRAA >60  --   PROT 7.4 7.0  ALBUMIN 4.0 3.8  AST 19 18  ALT 18 18  ALKPHOS 47 49  BILITOT 0.5 0.7   Iron/TIBC/Ferritin/ %Sat No results found for: IRON, TIBC, FERRITIN, IRONPCTSAT   I have reviewed patient's recent labs done at [facility] clinic.  09/06/2017 Cbc hemoglobin 14.8, hematocrit 43.7, platelet counts 227, WBC 5.7.  Normal differential. Normal creatinine and bilirubin level.  ASSESSMENT & PLAN:  1. Ductal carcinoma in situ (DCIS) of left breast   2. Long-term current use of tamoxifen    ER weakly positive, she tolerates tamoxifen. Magnitude of preventive effects from tamoxifen in weakly ER positive DCIS may be lower. Labs are reviewed and discussed with patient Currently she tolerates tamoxifen 20 mg daily quite well.  Started on aspirin 81 mg since last visit.  Tolerated well. We discussed about the option of aromatase inhibitor.  And discussed in details about the rationale and potential side effects profiles of both tamoxifen and aromatase inhibitor. Patient decides to continue on tamoxifen.  Discussed with her about thrombosis precautions. Recommend annual gynecology pelvic exam.  Cholesterol annually. She gets mammogram done through gynecologist office.  Follow-up in 6 months.    Earlie Server, MD, PhD Hematology  Oncology [facility] at [facility] Pager- [phone]

## 2020-11-10 ENCOUNTER — Telehealth: Payer: Self-pay | Admitting: Oncology

## 2020-11-10 ENCOUNTER — Other Ambulatory Visit: Payer: Self-pay | Admitting: Oncology

## 2020-11-10 DIAGNOSIS — Z1231 Encounter for screening mammogram for malignant neoplasm of breast: Secondary | ICD-10-CM

## 2020-11-10 NOTE — Telephone Encounter (Signed)
Pt called to request a order for mammogram to be entered. She normally sees Dr. Ouida Sills and that appt was canceled until Sept. She would like to stay on schedule for her mammogram for August.

## 2020-11-10 NOTE — Telephone Encounter (Signed)
Spoke to patient. She called [facility] and got appt scheduled for August.

## 2020-12-01 ENCOUNTER — Other Ambulatory Visit: Payer: Self-pay

## 2020-12-01 ENCOUNTER — Ambulatory Visit
Admission: RE | Admit: 2020-12-01 | Discharge: 2020-12-01 | Disposition: A | Discharge status: Home / Self Care | Payer: BC Managed Care – PPO | Source: Ambulatory Visit | Attending: Oncology | Admitting: Oncology

## 2020-12-01 DIAGNOSIS — Z1231 Encounter for screening mammogram for malignant neoplasm of breast: Secondary | ICD-10-CM | POA: Insufficient documentation

## 2020-12-10 ENCOUNTER — Other Ambulatory Visit: Payer: Self-pay | Admitting: Oncology

## 2020-12-13 ENCOUNTER — Telehealth: Payer: Self-pay | Admitting: Oncology

## 2020-12-13 NOTE — Telephone Encounter (Signed)
Pt will need labs because she is on tamoxifen and blood counts need to be monitored.   Please r/s labs, then Mycart (per pt request) 1-2 days after labs.

## 2020-12-13 NOTE — Telephone Encounter (Signed)
Dr. Tasia Catchings will pt need labs?

## 2020-12-13 NOTE — Telephone Encounter (Signed)
Pt called and would like her appt changed to a Mychart visit. She is scheduled for labs also, but wants to know why she has to have labs? Please advise. I need to reschedule the aptient anyway because shei s scheduled for 9/1 which is a Thursday.

## 2020-12-14 NOTE — Telephone Encounter (Signed)
Colette, will you r/s pt and call with appts please:   Labs, then Mychart 1- 2 days after labs

## 2020-12-15 ENCOUNTER — Other Ambulatory Visit: Payer: Self-pay

## 2020-12-15 DIAGNOSIS — D0512 Intraductal carcinoma in situ of left breast: Secondary | ICD-10-CM

## 2020-12-16 ENCOUNTER — Inpatient Hospital Stay: Payer: BC Managed Care – PPO | Admitting: Oncology

## 2020-12-16 ENCOUNTER — Inpatient Hospital Stay: Payer: BC Managed Care – PPO | Attending: Oncology

## 2020-12-16 ENCOUNTER — Encounter: Payer: Self-pay | Admitting: Oncology

## 2020-12-16 ENCOUNTER — Inpatient Hospital Stay (HOSPITAL_BASED_OUTPATIENT_CLINIC_OR_DEPARTMENT_OTHER): Payer: BC Managed Care – PPO | Admitting: Oncology

## 2020-12-16 DIAGNOSIS — E063 Autoimmune thyroiditis: Secondary | ICD-10-CM | POA: Diagnosis not present

## 2020-12-16 DIAGNOSIS — Z7982 Long term (current) use of aspirin: Secondary | ICD-10-CM | POA: Diagnosis not present

## 2020-12-16 DIAGNOSIS — Z801 Family history of malignant neoplasm of trachea, bronchus and lung: Secondary | ICD-10-CM | POA: Insufficient documentation

## 2020-12-16 DIAGNOSIS — Z8042 Family history of malignant neoplasm of prostate: Secondary | ICD-10-CM | POA: Diagnosis not present

## 2020-12-16 DIAGNOSIS — Z79810 Long term (current) use of selective estrogen receptor modulators (SERMs): Secondary | ICD-10-CM | POA: Insufficient documentation

## 2020-12-16 DIAGNOSIS — Z803 Family history of malignant neoplasm of breast: Secondary | ICD-10-CM | POA: Insufficient documentation

## 2020-12-16 DIAGNOSIS — Z79899 Other long term (current) drug therapy: Secondary | ICD-10-CM | POA: Insufficient documentation

## 2020-12-16 DIAGNOSIS — D0512 Intraductal carcinoma in situ of left breast: Secondary | ICD-10-CM | POA: Insufficient documentation

## 2020-12-16 DIAGNOSIS — Z170 Estrogen receptor positive status [ER+]: Secondary | ICD-10-CM | POA: Diagnosis not present

## 2020-12-16 LAB — COMPREHENSIVE METABOLIC PANEL
ALT: 20 U/L (ref 0–44)
AST: 20 U/L (ref 15–41)
Albumin: 3.8 g/dL (ref 3.5–5.0)
Alkaline Phosphatase: 47 U/L (ref 38–126)
Anion gap: 8 (ref 5–15)
BUN: 18 mg/dL (ref 6–20)
CO2: 25 mmol/L (ref 22–32)
Calcium: 8.9 mg/dL (ref 8.9–10.3)
Chloride: 103 mmol/L (ref 98–111)
Creatinine, Ser: 0.82 mg/dL (ref 0.44–1.00)
GFR, Estimated: >60 mL/min (ref >60)
Glucose, Bld: 99 mg/dL (ref 70–99)
Potassium: 4.6 mmol/L (ref 3.5–5.1)
Sodium: 136 mmol/L (ref 135–145)
Total Bilirubin: 0.5 mg/dL (ref 0.3–1.2)
Total Protein: 7 g/dL (ref 6.5–8.1)

## 2020-12-16 LAB — CBC WITH DIFFERENTIAL/PLATELET
Abs Immature Granulocytes: 0.02 10*3/uL (ref 0.00–0.07)
Basophils Absolute: 0 10*3/uL (ref 0.0–0.1)
Basophils Relative: 1 %
Eosinophils Absolute: 0.2 10*3/uL (ref 0.0–0.5)
Eosinophils Relative: 3 %
HCT: 44.3 % (ref 36.0–46.0)
Hemoglobin: 14.5 g/dL (ref 12.0–15.0)
Immature Granulocytes: 0 %
Lymphocytes Relative: 30 %
Lymphs Abs: 1.7 10*3/uL (ref 0.7–4.0)
MCH: 29.1 pg (ref 26.0–34.0)
MCHC: 32.7 g/dL (ref 30.0–36.0)
MCV: 89 fL (ref 80.0–100.0)
Monocytes Absolute: 0.4 10*3/uL (ref 0.1–1.0)
Monocytes Relative: 7 %
Neutro Abs: 3.3 10*3/uL (ref 1.7–7.7)
Neutrophils Relative %: 59 %
Platelets: 201 10*3/uL (ref 150–400)
RBC: 4.98 MIL/uL (ref 3.87–5.11)
RDW: 12.5 % (ref 11.5–15.5)
WBC: 5.6 10*3/uL (ref 4.0–10.5)
nRBC: 0 % (ref 0.0–0.2)

## 2020-12-16 NOTE — Progress Notes (Signed)
HEMATOLOGY-ONCOLOGY TeleHEALTH VISIT PROGRESS NOTE  I connected with Victoria Hammond on 12/16/20  at  3:15 PM EDT by video enabled telemedicine visit and verified that I am speaking with the correct person using two identifiers. I discussed the limitations, risks, security and privacy concerns of performing an evaluation and management service by telemedicine and the availability of in-person appointments. The patient expressed understanding and agreed to proceed.   Other persons participating in the visit and their role in the encounter:  None  Patient's location: Home  Provider's location: office Chief Complaint: follow up for history of left breast hight grade DCIS   INTERVAL HISTORY Victoria Hammond is a 60 y.o. female who has above history reviewed by me today presents for follow up visit for management of left breast high grade DCIS Problems and complaints are listed below:  Patient takes Tamoxifen '20mg'$  daily. Tolerates well. Denies any new complaints today. No breast concerns.   Review of Systems  Constitutional:  Negative for appetite change, chills, fatigue and fever.  HENT:   Negative for hearing loss and voice change.   Eyes:  Negative for eye problems.  Respiratory:  Negative for chest tightness and cough.   Cardiovascular:  Negative for chest pain.  Gastrointestinal:  Negative for abdominal distention, abdominal pain and blood in stool.  Endocrine: Negative for hot flashes.  Genitourinary:  Negative for difficulty urinating and frequency.   Musculoskeletal:  Negative for arthralgias.  Skin:  Negative for itching and rash.  Neurological:  Negative for extremity weakness.  Hematological:  Negative for adenopathy.  Psychiatric/Behavioral:  Negative for confusion.    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   Hashimoto's thyroiditis    Hypothyroidism    Personal history of radiation therapy 2019   Vitamin D deficiency     Past Surgical History:  Procedure Laterality Date   BREAST BIOPSY Left 12/13/2017   affirm stereo biopsy/HIGH-GRADE DUCTAL CARCINOMA IN SITU, COMEDO TYPE    BREAST CYST ASPIRATION Left 02/16/2005   BREAST LUMPECTOMY Left 01/11/2018   HIGH-GRADE DUCTAL CARCINOMA IN SITU, COMEDO TYPE    CERVICAL POLYPECTOMY  03/28/2006   COLONOSCOPY     PARTIAL MASTECTOMY WITH NEEDLE LOCALIZATION Left 01/11/2018   Procedure: PARTIAL MASTECTOMY WITH NEEDLE LOCALIZATION;  Surgeon: Benjamine Sprague, DO;  Location: [facility];  Service: General;  Laterality: Left;    Family History  Problem Relation Age of Onset   Hypertension Mother    Congestive Heart Failure Father    Lung cancer Maternal Uncle    Ovarian cancer Maternal Grandmother        ovarian cancer mets to lung and brain   Esophageal cancer Maternal Uncle    Liver cancer Maternal Uncle    Breast cancer Neg Hx     Social History   Socioeconomic History   Marital status: Single    Spouse name: Not on file   Number of children: Not on file   Years of education: Not on file   Highest education level: Not on file  Occupational History   Not on file  Tobacco Use   Smoking status: Former    Packs/day: 1.00    Years: 8.00    Pack years: 8.00    Types: Cigarettes    Quit date: 01/03/1988    Years since quitting: 32.9   Smokeless tobacco: Never  Vaping Use   Vaping Use: Never used  Substance and Sexual Activity  Alcohol use: Never   Drug use: Never   Sexual activity: Not on file  Other Topics Concern   Not on file  Social History Narrative   Not on file   Social Determinants of Health   Financial Resource Strain: Not on file  Food Insecurity: Not on file  Transportation Needs: Not on file  Physical Activity: Not on file  Stress: Not on file  Social Connections: Not on file  Intimate Partner Violence: Not on file    Current Outpatient Medications on File Prior to Visit  Medication Sig Dispense Refill   ALPRAZolam (XANAX) 0.5 MG  tablet Take 0.25 mg by mouth at bedtime as needed for sleep.      aspirin 81 MG chewable tablet Chew 81 mg by mouth daily.     cetirizine (ZYRTEC) 10 MG tablet Take 10 mg by mouth.     Cholecalciferol (VITAMIN D3) 2000 units capsule Take 2,000 Units by mouth every evening.      etodolac (LODINE) 400 MG tablet Take 400 mg by mouth every evening.   3   etodolac (LODINE) 400 MG tablet Take 1 tablet by mouth daily.     levothyroxine (SYNTHROID, LEVOTHROID) 75 MCG tablet Take 75 mcg by mouth daily before breakfast.      magnesium oxide (MAG-OX) 400 MG tablet Take by mouth.     tamoxifen (NOLVADEX) 20 MG tablet TAKE 1 TABLET BY MOUTH EVERY DAY 30 tablet 0   vitamin B-12 (CYANOCOBALAMIN) 1000 MCG tablet Take 1,000 mcg by mouth every evening.      Vitamin E 400 units TABS Take 1 tablet by mouth daily.     ALPRAZolam (XANAX) 0.5 MG tablet Take by mouth. (Patient not taking: Reported on 12/16/2020)     cefUROXime (CEFTIN) 250 MG tablet SMARTSIG:1 Tablet(s) By Mouth Every 12 Hours     omeprazole (PRILOSEC) 40 MG capsule Take by mouth. (Patient not taking: No sig reported)     No current facility-administered medications on file prior to visit.    Allergies  Allergen Reactions   Sulfa Antibiotics Nausea And Vomiting   Chlorhexidine Gluconate Rash       Observations/Objective: Today's Vitals   12/16/20 1430  PainSc: 0-No pain   There is no height or weight on file to calculate BMI.  Physical Exam Neurological:     Mental Status: She is alert.    CBC    Component Value Date/Time   WBC 5.6 12/16/2020 0800   RBC 4.98 12/16/2020 0800   HGB 14.5 12/16/2020 0800   HCT 44.3 12/16/2020 0800   PLT 201 12/16/2020 0800   MCV 89.0 12/16/2020 0800   MCH 29.1 12/16/2020 0800   MCHC 32.7 12/16/2020 0800   RDW 12.5 12/16/2020 0800   LYMPHSABS 1.7 12/16/2020 0800   MONOABS 0.4 12/16/2020 0800   EOSABS 0.2 12/16/2020 0800   BASOSABS 0.0 12/16/2020 0800    CMP     Component Value Date/Time   NA  136 12/16/2020 0800   K 4.6 12/16/2020 0800   CL 103 12/16/2020 0800   CO2 25 12/16/2020 0800   GLUCOSE 99 12/16/2020 0800   BUN 18 12/16/2020 0800   CREATININE 0.82 12/16/2020 0800   CALCIUM 8.9 12/16/2020 0800   PROT 7.0 12/16/2020 0800   ALBUMIN 3.8 12/16/2020 0800   AST 20 12/16/2020 0800   ALT 20 12/16/2020 0800   ALKPHOS 47 12/16/2020 0800   BILITOT 0.5 12/16/2020 0800   GFRNONAA >60 12/16/2020 0800   GFRAA >  60 12/15/2019 0941     Assessment and Plan: 1. Ductal carcinoma in situ (DCIS) of left breast   2. Long-term current use of tamoxifen     ER weakly positive DCIS Labs are reviewed and discussed with patient. She tolerates Tamoxifen well. Continue current regimen.  Continue Aspirin '81mg'$ .  Annual gyn pelvic exam.   Bilateral screening mammogram shows no evidence of malignancy.  Her mammogram is usually ordered by her gynecologist.    Follow Up Instructions: Follow up in 6 months.  Patient says she will call cancer center to get her appt scheduled.    I discussed the assessment and treatment plan with the patient. The patient was provided an opportunity to ask questions and all were answered. The patient agreed with the plan and demonstrated an understanding of the instructions.  The patient was advised to call back or seek an in-person evaluation if the symptoms worsen or if the condition fails to improve as anticipated.   Earlie Server, MD 12/16/2020 10:45 PM

## 2021-01-04 ENCOUNTER — Other Ambulatory Visit: Payer: Self-pay | Admitting: Oncology

## 2021-01-19 ENCOUNTER — Other Ambulatory Visit: Payer: Self-pay | Admitting: *Deleted

## 2021-01-19 MED ORDER — TAMOXIFEN CITRATE 20 MG PO TABS: 20.0000 mg | ORAL_TABLET | Freq: Every day | ORAL | 1 refills | Status: DC

## 2021-06-16 ENCOUNTER — Encounter: Payer: Self-pay | Admitting: Oncology

## 2021-06-16 ENCOUNTER — Inpatient Hospital Stay: Payer: BC Managed Care – PPO | Admitting: Oncology

## 2021-06-16 ENCOUNTER — Other Ambulatory Visit: Payer: Self-pay

## 2021-06-16 ENCOUNTER — Other Ambulatory Visit: Payer: Self-pay | Admitting: Oncology

## 2021-06-16 ENCOUNTER — Inpatient Hospital Stay: Payer: BC Managed Care – PPO | Attending: Oncology

## 2021-06-16 VITALS — BP 116/65 | HR 75 | Temp 96.5°F | Resp 18 | Wt 167.6 lb

## 2021-06-16 DIAGNOSIS — D0512 Intraductal carcinoma in situ of left breast: Secondary | ICD-10-CM

## 2021-06-16 DIAGNOSIS — Z79810 Long term (current) use of selective estrogen receptor modulators (SERMs): Secondary | ICD-10-CM

## 2021-06-16 DIAGNOSIS — Z923 Personal history of irradiation: Secondary | ICD-10-CM | POA: Diagnosis not present

## 2021-06-16 DIAGNOSIS — Z800 Family history of malignant neoplasm of digestive organs: Secondary | ICD-10-CM | POA: Diagnosis not present

## 2021-06-16 DIAGNOSIS — Z7982 Long term (current) use of aspirin: Secondary | ICD-10-CM | POA: Diagnosis not present

## 2021-06-16 DIAGNOSIS — E063 Autoimmune thyroiditis: Secondary | ICD-10-CM | POA: Insufficient documentation

## 2021-06-16 DIAGNOSIS — Z801 Family history of malignant neoplasm of trachea, bronchus and lung: Secondary | ICD-10-CM | POA: Diagnosis not present

## 2021-06-16 DIAGNOSIS — E559 Vitamin D deficiency, unspecified: Secondary | ICD-10-CM | POA: Diagnosis not present

## 2021-06-16 DIAGNOSIS — Z87891 Personal history of nicotine dependence: Secondary | ICD-10-CM | POA: Diagnosis not present

## 2021-06-16 DIAGNOSIS — Z170 Estrogen receptor positive status [ER+]: Secondary | ICD-10-CM | POA: Insufficient documentation

## 2021-06-16 LAB — COMPREHENSIVE METABOLIC PANEL
ALT: 20 U/L (ref 0–44)
AST: 22 U/L (ref 15–41)
Albumin: 4 g/dL (ref 3.5–5.0)
Alkaline Phosphatase: 47 U/L (ref 38–126)
Anion gap: 5 (ref 5–15)
BUN: 17 mg/dL (ref 6–20)
CO2: 27 mmol/L (ref 22–32)
Calcium: 9 mg/dL (ref 8.9–10.3)
Chloride: 104 mmol/L (ref 98–111)
Creatinine, Ser: 0.72 mg/dL (ref 0.44–1.00)
GFR, Estimated: >60 mL/min (ref >60)
Glucose, Bld: 90 mg/dL (ref 70–99)
Potassium: 4.2 mmol/L (ref 3.5–5.1)
Sodium: 136 mmol/L (ref 135–145)
Total Bilirubin: 0.3 mg/dL (ref 0.3–1.2)
Total Protein: 7.1 g/dL (ref 6.5–8.1)

## 2021-06-16 LAB — CBC WITH DIFFERENTIAL/PLATELET
Abs Immature Granulocytes: 0.01 10*3/uL (ref 0.00–0.07)
Basophils Absolute: 0 10*3/uL (ref 0.0–0.1)
Basophils Relative: 0 %
Eosinophils Absolute: 0.1 10*3/uL (ref 0.0–0.5)
Eosinophils Relative: 2 %
HCT: 43.2 % (ref 36.0–46.0)
Hemoglobin: 14.4 g/dL (ref 12.0–15.0)
Immature Granulocytes: 0 %
Lymphocytes Relative: 27 %
Lymphs Abs: 1.7 10*3/uL (ref 0.7–4.0)
MCH: 29.5 pg (ref 26.0–34.0)
MCHC: 33.3 g/dL (ref 30.0–36.0)
MCV: 88.5 fL (ref 80.0–100.0)
Monocytes Absolute: 0.4 10*3/uL (ref 0.1–1.0)
Monocytes Relative: 6 %
Neutro Abs: 3.9 10*3/uL (ref 1.7–7.7)
Neutrophils Relative %: 65 %
Platelets: 230 10*3/uL (ref 150–400)
RBC: 4.88 MIL/uL (ref 3.87–5.11)
RDW: 12.5 % (ref 11.5–15.5)
WBC: 6.1 10*3/uL (ref 4.0–10.5)
nRBC: 0 % (ref 0.0–0.2)

## 2021-06-16 NOTE — Progress Notes (Signed)
- Hematology/Oncology Progress note [phone] [phone]      Patient Care Team: Rusty Aus, MD as PCP - General (Internal Medicine) Surgeon: Dr.Sakai  REASON FOR VISIT Follow up for treatment of high grade DCIS  HISTORY OF PRESENTING ILLNESS:  Victoria Hammond is a  61 y.o.  female with PMH listed below who was referred to me for evaluation of  Patient had screening mammogram on 11/22/2017 which showed suspicious calcifications.  She underwent digital diagnostic mammogram on 12/05/2017 which confirmed grouped fine pleomorphic calcifications within the upper outer quadrant of the left breast.  Spanning 8 mm.  Patient underwent stereotactic biopsy of left breast calcification. Biopsy pathology showed: High-grade ductal carcinoma in situ, comedo type with associated calcifications.  Nipple discharge: Denies Family history: Maternal grandmother deceased from lung and the brain cancer, maternal uncle deceased from lung cancer.  Maternal uncle deceased from esophageal cancer.  Another maternal uncle deceased from liver cancer.  Negative for breast cancer family history.  Patient declined genetic counseling  OCP use: used OCP between age of [age].  Estrogen and progesterone therapy: denies History of radiation to chest: denies.  Previous breast surgery: History of breast cyst aspiration.  01/11/2018 left breast lumpectomy by Dr. Lysle Pearl.  Pathology showed high-grade DCIS, comedo type with associated calcifications. Size of DCIS at least 4 mm, grade 3, comedo necrosis, margins not involved by DCIS, distance from closest margin 8 mm. pTis pNx Family history of ovarian cancer, lung cancer, esophageal cancer, liver cancer, patient declined genetic counseling.  # finished adjuvant radiation on 12/31/ 2019 #Started on tamoxifen 20 mg daily in January 2020.  INTERVAL HISTORY Victoria Hammond is a 61 y.o. female who has above history reviewed by me today presents for  evaluation of follow-up of DCIS She history of left breast high-grade DCIS, ER weakly positive[ 1-10%}. S/p lumpectomy and radiation. Patient is on tamoxifen 20 mg daily.  Patient tolerates well She follows up with gynecologist office, recently seen in November 2022. + endometrial cells on pap smear, endometrial biopsy showed atrophic endometrium.  No hypoglycemia or carcinoma. Denies any vaginal bleeding.  Last menstrual period was in April 2016.   Review of Systems  Constitutional:  Negative for chills, fever, malaise/fatigue and weight loss.  HENT:  Negative for nosebleeds and sore throat.   Eyes:  Negative for double vision, photophobia and redness.  Respiratory:  Negative for cough, shortness of breath and wheezing.   Cardiovascular:  Negative for chest pain, palpitations, orthopnea and leg swelling.  Gastrointestinal:  Negative for abdominal pain, blood in stool, nausea and vomiting.  Genitourinary:  Negative for dysuria.  Musculoskeletal:  Negative for back pain, myalgias and neck pain.  Skin:  Negative for itching and rash.  Neurological:  Negative for dizziness, tingling and tremors.  Endo/Heme/Allergies:  Negative for environmental allergies. Does not bruise/bleed easily.  Psychiatric/Behavioral:  Negative for depression and hallucinations. The patient is not nervous/anxious.    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   Hashimoto's thyroiditis    Hypothyroidism    Personal history of radiation therapy 2019   Vitamin D deficiency     SURGICAL HISTORY: Past Surgical History:  Procedure Laterality Date   BREAST BIOPSY Left 12/13/2017   affirm stereo biopsy/HIGH-GRADE DUCTAL CARCINOMA IN SITU, COMEDO TYPE    BREAST CYST ASPIRATION Left 02/16/2005   BREAST LUMPECTOMY Left 01/11/2018   HIGH-GRADE DUCTAL CARCINOMA IN SITU, COMEDO  TYPE    CERVICAL POLYPECTOMY  03/28/2006   COLONOSCOPY     PARTIAL  MASTECTOMY WITH NEEDLE LOCALIZATION Left 01/11/2018   Procedure: PARTIAL MASTECTOMY WITH NEEDLE LOCALIZATION;  Surgeon: Benjamine Sprague, DO;  Location: [facility];  Service: General;  Laterality: Left;    SOCIAL HISTORY: Social History   Socioeconomic History   Marital status: Single    Spouse name: Not on file   Number of children: Not on file   Years of education: Not on file   Highest education level: Not on file  Occupational History   Not on file  Tobacco Use   Smoking status: Former    Packs/day: 1.00    Years: 8.00    Pack years: 8.00    Types: Cigarettes    Quit date: 01/03/1988    Years since quitting: 33.4   Smokeless tobacco: Never  Vaping Use   Vaping Use: Never used  Substance and Sexual Activity   Alcohol use: Never   Drug use: Never   Sexual activity: Not on file  Other Topics Concern   Not on file  Social History Narrative   Not on file   Social Determinants of Health   Financial Resource Strain: Not on file  Food Insecurity: Not on file  Transportation Needs: Not on file  Physical Activity: Not on file  Stress: Not on file  Social Connections: Not on file  Intimate Partner Violence: Not on file    FAMILY HISTORY: Family History  Problem Relation Age of Onset   Hypertension Mother    Congestive Heart Failure Father    Lung cancer Maternal Uncle    Ovarian cancer Maternal Grandmother        ovarian cancer mets to lung and brain   Esophageal cancer Maternal Uncle    Liver cancer Maternal Uncle    Breast cancer Neg Hx     ALLERGIES:  is allergic to sulfa antibiotics and chlorhexidine gluconate.  MEDICATIONS:  Current Outpatient Medications  Medication Sig Dispense Refill   ALPRAZolam (XANAX) 0.5 MG tablet Take 0.25 mg by mouth at bedtime as needed for sleep.      aspirin 81 MG chewable tablet Chew 81 mg by mouth daily.     cetirizine (ZYRTEC) 10 MG tablet Take 10 mg by mouth.     Cholecalciferol (VITAMIN D3) 2000 units capsule Take 2,000 Units  by mouth every evening.      etodolac (LODINE) 400 MG tablet Take 400 mg by mouth every evening.   3   levothyroxine (SYNTHROID, LEVOTHROID) 75 MCG tablet Take 75 mcg by mouth daily before breakfast.      magnesium oxide (MAG-OX) 400 MG tablet Take by mouth.     tamoxifen (NOLVADEX) 20 MG tablet Take 1 tablet (20 mg total) by mouth daily. 90 tablet 1   vitamin B-12 (CYANOCOBALAMIN) 1000 MCG tablet Take 1,000 mcg by mouth every evening.      Vitamin E 400 units TABS Take 1 tablet by mouth daily.     No current facility-administered medications for this visit.     PHYSICAL EXAMINATION: ECOG PERFORMANCE STATUS: 0 - Asymptomatic Vitals:   06/16/21 0945  BP: 116/65  Pulse: 75  Resp: 18  Temp: (!) 96.5 F (35.8 C)   Filed Weights   06/16/21 0945  Weight: 167 lb 9.6 oz (76 kg)    Physical Exam Constitutional:      General: She is not in acute distress. HENT:     Head: Normocephalic and atraumatic.  Eyes:     General: No scleral icterus.    Pupils: Pupils are equal, round, and reactive to light.  Cardiovascular:     Rate and Rhythm: Normal rate and regular rhythm.     Heart sounds: Normal heart sounds.  Pulmonary:     Effort: Pulmonary effort is normal. No respiratory distress.     Breath sounds: No wheezing.  Abdominal:     General: Bowel sounds are normal. There is no distension.     Palpations: Abdomen is soft. There is no mass.     Tenderness: There is no abdominal tenderness.  Musculoskeletal:        General: No deformity. Normal range of motion.     Cervical back: Normal range of motion and neck supple.  Skin:    General: Skin is warm and dry.     Findings: No erythema or rash.  Neurological:     Mental Status: She is alert and oriented to person, place, and time. Mental status is at baseline.     Cranial Nerves: No cranial nerve deficit.     Coordination: Coordination normal.  Psychiatric:        Mood and Affect: Mood normal.        Behavior: Behavior normal.         Thought Content: Thought content normal.     LABORATORY DATA:  I have reviewed the data as listed Lab Results  Component Value Date   WBC 6.1 06/16/2021   HGB 14.4 06/16/2021   HCT 43.2 06/16/2021   MCV 88.5 06/16/2021   PLT 230 06/16/2021   Recent Labs    12/16/20 0800 06/16/21 0930  NA 136 136  K 4.6 4.2  CL 103 104  CO2 25 27  GLUCOSE 99 90  BUN 18 17  CREATININE 0.82 0.72  CALCIUM 8.9 9.0  GFRNONAA >60 >60  PROT 7.0 7.1  ALBUMIN 3.8 4.0  AST 20 22  ALT 20 20  ALKPHOS 47 47  BILITOT 0.5 0.3    Iron/TIBC/Ferritin/ %Sat No results found for: IRON, TIBC, FERRITIN, IRONPCTSAT   I have reviewed patient's recent labs done at [facility].  09/06/2017 Cbc hemoglobin 14.8, hematocrit 43.7, platelet counts 227, WBC 5.7.  Normal differential. Normal creatinine and bilirubin level.  ASSESSMENT & PLAN:  1. Ductal carcinoma in situ (DCIS) of left breast   2. Long-term current use of tamoxifen    ER weakly positive, she tolerates tamoxifen. Magnitude of preventive effects from tamoxifen in weakly ER positive DCIS may be lower. Labs are reviewed and discussed with patient. Anthon is pending.  Last menstrual period in 2016. Discussed with the patient about switching tamoxifen to aromatase inhibitor.  Rationale and potential side effects were reviewed with patient.  She agrees with the plan. Recommend to check baseline bone density. Recommend calcium and vitamin D supplementation. Obtain annual screening mammogram to be done in August 2023.  Follow-up in 6 months.    Earlie Server, MD, PhD Hematology Oncology

## 2021-06-16 NOTE — Progress Notes (Signed)
Pt here for follow up. Reports having some tightness under right arm when she raises her arm.  ?

## 2021-06-17 ENCOUNTER — Telehealth: Payer: Self-pay | Admitting: *Deleted

## 2021-06-17 LAB — FOLLICLE STIMULATING HORMONE: FSH: 41 m[IU]/mL

## 2021-06-17 NOTE — Telephone Encounter (Signed)
Patient contacted the cancer center to change her apts in August to the first week in Sept. She initially wanted to come early am if possible. I explained that the apt slots are blocked b/w 8:30-930 for patient's needing chemotherapy. She gave verbal understanding and then requested her apts to be changed to 9/7 in the afternoon. ? ?Apts r/s per her request- lab on 9/7 at 230 am and md apt with Dr. Tasia Catchings at 245 pm. ? ?Pt thanked me for calling her back. ?

## 2021-06-19 ENCOUNTER — Other Ambulatory Visit: Payer: Self-pay | Admitting: Oncology

## 2021-06-19 MED ORDER — ANASTROZOLE 1 MG PO TABS: 1.0000 mg | ORAL_TABLET | Freq: Every day | ORAL | 1 refills | Status: DC

## 2021-06-20 ENCOUNTER — Telehealth: Payer: Self-pay

## 2021-06-20 ENCOUNTER — Ambulatory Visit: Payer: Self-pay | Admitting: General Surgery

## 2021-06-20 ENCOUNTER — Encounter: Payer: Self-pay | Admitting: General Surgery

## 2021-06-20 ENCOUNTER — Ambulatory Visit: Payer: BC Managed Care – PPO | Admitting: Certified Registered Nurse Anesthetist

## 2021-06-20 ENCOUNTER — Other Ambulatory Visit: Payer: Self-pay | Admitting: Internal Medicine

## 2021-06-20 ENCOUNTER — Other Ambulatory Visit: Payer: Self-pay

## 2021-06-20 ENCOUNTER — Ambulatory Visit
Admission: RE | Admit: 2021-06-20 | Discharge: 2021-06-20 | Disposition: A | Discharge status: Home / Self Care | Payer: BC Managed Care – PPO | Source: Ambulatory Visit | Attending: General Surgery | Admitting: General Surgery

## 2021-06-20 ENCOUNTER — Encounter
Admission: RE | Disposition: A | Discharge status: Home / Self Care | Payer: Self-pay | Source: Ambulatory Visit | Attending: General Surgery

## 2021-06-20 ENCOUNTER — Ambulatory Visit
Admission: RE | Admit: 2021-06-20 | Discharge: 2021-06-20 | Disposition: A | Discharge status: Home / Self Care | Payer: BC Managed Care – PPO | Source: Ambulatory Visit | Attending: Internal Medicine | Admitting: Internal Medicine

## 2021-06-20 DIAGNOSIS — E559 Vitamin D deficiency, unspecified: Secondary | ICD-10-CM | POA: Diagnosis not present

## 2021-06-20 DIAGNOSIS — Z86000 Personal history of in-situ neoplasm of breast: Secondary | ICD-10-CM | POA: Insufficient documentation

## 2021-06-20 DIAGNOSIS — K353 Acute appendicitis with localized peritonitis, without perforation or gangrene: Secondary | ICD-10-CM

## 2021-06-20 DIAGNOSIS — Z87891 Personal history of nicotine dependence: Secondary | ICD-10-CM | POA: Insufficient documentation

## 2021-06-20 DIAGNOSIS — E063 Autoimmune thyroiditis: Secondary | ICD-10-CM | POA: Diagnosis not present

## 2021-06-20 DIAGNOSIS — K3580 Unspecified acute appendicitis: Secondary | ICD-10-CM | POA: Diagnosis present

## 2021-06-20 HISTORY — PX: XI ROBOTIC LAPAROSCOPIC ASSISTED APPENDECTOMY: SHX6877

## 2021-06-20 LAB — POCT I-STAT CREATININE: Creatinine, Ser: 0.8 mg/dL (ref 0.44–1.00)

## 2021-06-20 SURGERY — APPENDECTOMY, ROBOT-ASSISTED, LAPAROSCOPIC
Anesthesia: General

## 2021-06-20 MED ORDER — BUPIVACAINE LIPOSOME 1.3 % IJ SUSP
INTRAMUSCULAR | Status: AC
Start: 2021-06-20 — End: ?
  Filled 2021-06-20: qty 20

## 2021-06-20 MED ORDER — LIDOCAINE HCL (PF) 2 % IJ SOLN
INTRAMUSCULAR | Status: AC
  Filled 2021-06-20: qty 5

## 2021-06-20 MED ORDER — FENTANYL CITRATE (PF) 100 MCG/2ML IJ SOLN
INTRAMUSCULAR | Status: DC | PRN
  Administered 2021-06-20 (×2): 50 ug via INTRAVENOUS

## 2021-06-20 MED ORDER — APREPITANT 40 MG PO CAPS
ORAL_CAPSULE | ORAL | Status: AC
  Filled 2021-06-20: qty 1

## 2021-06-20 MED ORDER — BUPIVACAINE LIPOSOME 1.3 % IJ SUSP
INTRAMUSCULAR | Status: DC | PRN
  Administered 2021-06-20: 20 mL

## 2021-06-20 MED ORDER — SUGAMMADEX SODIUM 200 MG/2ML IV SOLN
INTRAVENOUS | Status: DC | PRN
  Administered 2021-06-20: 200 mg via INTRAVENOUS

## 2021-06-20 MED ORDER — PROPOFOL 10 MG/ML IV BOLUS
INTRAVENOUS | Status: AC
  Filled 2021-06-20: qty 20

## 2021-06-20 MED ORDER — LACTATED RINGERS IV SOLN: INTRAVENOUS | Status: DC | PRN

## 2021-06-20 MED ORDER — FENTANYL CITRATE (PF) 100 MCG/2ML IJ SOLN
INTRAMUSCULAR | Status: AC
  Filled 2021-06-20: qty 2

## 2021-06-20 MED ORDER — ROCURONIUM BROMIDE 100 MG/10ML IV SOLN
INTRAVENOUS | Status: DC | PRN
  Administered 2021-06-20: 10 mg via INTRAVENOUS
  Administered 2021-06-20: 40 mg via INTRAVENOUS

## 2021-06-20 MED ORDER — LIDOCAINE HCL (CARDIAC) PF 100 MG/5ML IV SOSY
PREFILLED_SYRINGE | INTRAVENOUS | Status: DC | PRN
  Administered 2021-06-20: 100 mg via INTRAVENOUS

## 2021-06-20 MED ORDER — ACETAMINOPHEN 10 MG/ML IV SOLN
INTRAVENOUS | Status: AC
  Filled 2021-06-20: qty 100

## 2021-06-20 MED ORDER — KETOROLAC TROMETHAMINE 30 MG/ML IJ SOLN
INTRAMUSCULAR | Status: AC
  Filled 2021-06-20: qty 1

## 2021-06-20 MED ORDER — KETOROLAC TROMETHAMINE 30 MG/ML IJ SOLN
INTRAMUSCULAR | Status: DC | PRN
  Administered 2021-06-20: 30 mg via INTRAVENOUS

## 2021-06-20 MED ORDER — CEFAZOLIN SODIUM-DEXTROSE 2-4 GM/100ML-% IV SOLN
2.0000 g | INTRAVENOUS | Status: AC
Start: 2021-06-21 — End: 2021-06-20
  Administered 2021-06-20: 2 g via INTRAVENOUS

## 2021-06-20 MED ORDER — MIDAZOLAM HCL 2 MG/2ML IJ SOLN
INTRAMUSCULAR | Status: DC | PRN
  Administered 2021-06-20: 2 mg via INTRAVENOUS

## 2021-06-20 MED ORDER — APREPITANT 40 MG PO CAPS
40.0000 mg | ORAL_CAPSULE | Freq: Once | ORAL | Status: AC
  Administered 2021-06-20: 40 mg via ORAL

## 2021-06-20 MED ORDER — SUCCINYLCHOLINE CHLORIDE 200 MG/10ML IV SOSY
PREFILLED_SYRINGE | INTRAVENOUS | Status: DC | PRN
  Administered 2021-06-20: 100 mg via INTRAVENOUS

## 2021-06-20 MED ORDER — PROPOFOL 10 MG/ML IV BOLUS
INTRAVENOUS | Status: DC | PRN
  Administered 2021-06-20: 150 mg via INTRAVENOUS

## 2021-06-20 MED ORDER — DEXAMETHASONE SODIUM PHOSPHATE 10 MG/ML IJ SOLN
INTRAMUSCULAR | Status: DC | PRN
  Administered 2021-06-20: 10 mg via INTRAVENOUS

## 2021-06-20 MED ORDER — ONDANSETRON HCL 4 MG/2ML IJ SOLN: 4.0000 mg | Freq: Once | INTRAMUSCULAR | Status: DC | PRN

## 2021-06-20 MED ORDER — EPHEDRINE 5 MG/ML INJ
INTRAVENOUS | Status: AC
  Filled 2021-06-20: qty 5

## 2021-06-20 MED ORDER — FENTANYL CITRATE (PF) 100 MCG/2ML IJ SOLN
INTRAMUSCULAR | Status: AC
  Administered 2021-06-20: 25 ug via INTRAVENOUS
  Filled 2021-06-20: qty 2

## 2021-06-20 MED ORDER — EPHEDRINE SULFATE (PRESSORS) 50 MG/ML IJ SOLN
INTRAMUSCULAR | Status: DC | PRN
  Administered 2021-06-20: 10 mg via INTRAVENOUS
  Administered 2021-06-20: 5 mg via INTRAVENOUS

## 2021-06-20 MED ORDER — ONDANSETRON HCL 4 MG/2ML IJ SOLN
INTRAMUSCULAR | Status: DC | PRN
  Administered 2021-06-20: 4 mg via INTRAVENOUS

## 2021-06-20 MED ORDER — IOHEXOL 300 MG/ML  SOLN
100.0000 mL | Freq: Once | INTRAMUSCULAR | Status: AC | PRN
  Administered 2021-06-20: 100 mL via INTRAVENOUS

## 2021-06-20 MED ORDER — ACETAMINOPHEN 10 MG/ML IV SOLN
INTRAVENOUS | Status: DC | PRN
  Administered 2021-06-20: 1000 mg via INTRAVENOUS

## 2021-06-20 MED ORDER — CHLORHEXIDINE GLUCONATE 0.12 % MT SOLN
OROMUCOSAL | Status: AC
  Filled 2021-06-20: qty 15

## 2021-06-20 MED ORDER — BUPIVACAINE-EPINEPHRINE (PF) 0.5% -1:200000 IJ SOLN
INTRAMUSCULAR | Status: DC | PRN
  Administered 2021-06-20: 30 mL via PERINEURAL

## 2021-06-20 MED ORDER — OXYCODONE-ACETAMINOPHEN 5-325 MG PO TABS: 1.0000 | ORAL_TABLET | ORAL | 0 refills | Status: AC | PRN

## 2021-06-20 MED ORDER — ONDANSETRON HCL 4 MG/2ML IJ SOLN
INTRAMUSCULAR | Status: AC
  Filled 2021-06-20: qty 2

## 2021-06-20 MED ORDER — FENTANYL CITRATE (PF) 100 MCG/2ML IJ SOLN
25.0000 ug | INTRAMUSCULAR | Status: DC | PRN
  Administered 2021-06-20 (×2): 50 ug via INTRAVENOUS

## 2021-06-20 MED ORDER — MIDAZOLAM HCL 2 MG/2ML IJ SOLN
INTRAMUSCULAR | Status: AC
  Filled 2021-06-20: qty 2

## 2021-06-20 MED ORDER — CEFAZOLIN SODIUM-DEXTROSE 2-4 GM/100ML-% IV SOLN
INTRAVENOUS | Status: AC
  Filled 2021-06-20: qty 100

## 2021-06-20 MED ORDER — BUPIVACAINE-EPINEPHRINE (PF) 0.5% -1:200000 IJ SOLN
INTRAMUSCULAR | Status: AC
Start: 2021-06-20 — End: ?
  Filled 2021-06-20: qty 30

## 2021-06-20 SURGICAL SUPPLY — 64 items
BAG INFUSER PRESSURE 100CC (MISCELLANEOUS) IMPLANT
BLADE SURG SZ11 CARB STEEL (BLADE) ×2 IMPLANT
CANNULA REDUC XI 12-8 STAPL (CANNULA) ×1
CANNULA REDUCER 12-8 DVNC XI (CANNULA) ×1 IMPLANT
COVER TIP SHEARS 8 DVNC (MISCELLANEOUS) ×1 IMPLANT
COVER TIP SHEARS 8MM DA VINCI (MISCELLANEOUS) ×1
DERMABOND ADVANCED (GAUZE/BANDAGES/DRESSINGS) ×1
DERMABOND ADVANCED .7 DNX12 (GAUZE/BANDAGES/DRESSINGS) ×1 IMPLANT
DRAPE ARM DVNC X/XI (DISPOSABLE) ×4 IMPLANT
DRAPE COLUMN DVNC XI (DISPOSABLE) ×1 IMPLANT
DRAPE DA VINCI XI ARM (DISPOSABLE) ×4
DRAPE DA VINCI XI COLUMN (DISPOSABLE) ×1
ELECT REM PT RETURN 9FT ADLT (ELECTROSURGICAL) ×2
ELECTRODE REM PT RTRN 9FT ADLT (ELECTROSURGICAL) ×1 IMPLANT
GLOVE SURG PR MICRO ENCORE 7.5 (GLOVE) ×1 IMPLANT
GLOVE SURG SYN 6.5 ES PF (GLOVE) ×4 IMPLANT
GLOVE SURG SYN 6.5 PF PI (GLOVE) ×2 IMPLANT
GLOVE SURG UNDER POLY LF SZ6.5 (GLOVE) ×5 IMPLANT
GOWN STRL REUS W/ TWL LRG LVL3 (GOWN DISPOSABLE) ×3 IMPLANT
GOWN STRL REUS W/TWL LRG LVL3 (GOWN DISPOSABLE) ×4
GRASPER SUT TROCAR 14GX15 (MISCELLANEOUS) IMPLANT
IRRIGATOR SUCT 8 DISP DVNC XI (IRRIGATION / IRRIGATOR) IMPLANT
IRRIGATOR SUCTION 8MM XI DISP (IRRIGATION / IRRIGATOR)
IV NS 1000ML (IV SOLUTION)
IV NS 1000ML BAXH (IV SOLUTION) IMPLANT
KIT PINK PAD W/HEAD ARE REST (MISCELLANEOUS) ×2 IMPLANT
KIT PINK PAD W/HEAD ARM REST (MISCELLANEOUS) ×1 IMPLANT
LABEL OR SOLS (LABEL) IMPLANT
MANIFOLD NEPTUNE II (INSTRUMENTS) ×2 IMPLANT
NDL INSUFFLATION 14GA 120MM (NEEDLE) ×1 IMPLANT
NEEDLE HYPO 22GX1.5 SAFETY (NEEDLE) ×2 IMPLANT
NEEDLE INSUFFLATION 14GA 120MM (NEEDLE) ×2 IMPLANT
OBTURATOR OPTICAL STANDARD 8MM (TROCAR) ×1
OBTURATOR OPTICAL STND 8 DVNC (TROCAR) ×1
OBTURATOR OPTICALSTD 8 DVNC (TROCAR) ×1 IMPLANT
PACK LAP CHOLECYSTECTOMY (MISCELLANEOUS) ×2 IMPLANT
POUCH SPECIMEN RETRIEVAL 10MM (ENDOMECHANICALS) ×2 IMPLANT
RELOAD STAPLE 45 2.5 WHT DVNC (STAPLE) IMPLANT
RELOAD STAPLE 45 3.5 BLU DVNC (STAPLE) ×1 IMPLANT
RELOAD STAPLER 2.5X45 WHT DVNC (STAPLE) IMPLANT
RELOAD STAPLER 3.5X45 BLU DVNC (STAPLE) ×1 IMPLANT
SEAL CANN UNIV 5-8 DVNC XI (MISCELLANEOUS) ×3 IMPLANT
SEAL XI 5MM-8MM UNIVERSAL (MISCELLANEOUS) ×3
SEALER VESSEL DA VINCI XI (MISCELLANEOUS)
SEALER VESSEL EXT DVNC XI (MISCELLANEOUS) IMPLANT
SET TUBE SMOKE EVAC HIGH FLOW (TUBING) ×2 IMPLANT
SOLUTION ELECTROLUBE (MISCELLANEOUS) ×2 IMPLANT
SPONGE T-LAP 4X18 ~~LOC~~+RFID (SPONGE) ×2 IMPLANT
STAPLER 45 DA VINCI SURE FORM (STAPLE)
STAPLER 45 SUREFORM DVNC (STAPLE) ×1 IMPLANT
STAPLER CANNULA SEAL DVNC XI (STAPLE) ×1 IMPLANT
STAPLER CANNULA SEAL XI (STAPLE) ×1
STAPLER RELOAD 2.5X45 WHITE (STAPLE)
STAPLER RELOAD 2.5X45 WHT DVNC (STAPLE)
STAPLER RELOAD 3.5X45 BLU DVNC (STAPLE) ×1
STAPLER RELOAD 3.5X45 BLUE (STAPLE) ×1
SUT MNCRL AB 4-0 PS2 18 (SUTURE) ×2 IMPLANT
SUT VIC AB 2-0 SH 27 (SUTURE) ×1
SUT VIC AB 2-0 SH 27XBRD (SUTURE) ×1 IMPLANT
SUT VICRYL 0 AB UR-6 (SUTURE) ×2 IMPLANT
SUT VLOC 90 6 CV-15 VIOLET (SUTURE) ×2 IMPLANT
SYR 30ML LL (SYRINGE) ×2 IMPLANT
TRAY FOLEY MTR SLVR 16FR STAT (SET/KITS/TRAYS/PACK) ×1 IMPLANT
WATER STERILE IRR 500ML POUR (IV SOLUTION) ×2 IMPLANT

## 2021-06-20 NOTE — Anesthesia Procedure Notes (Signed)
Procedure Name: Intubation ?Date/Time: 06/20/2021 2:47 PM ?Performed by: Tollie Eth, CRNA ?Pre-anesthesia Checklist: Patient identified, Patient being monitored, Timeout performed, Emergency Drugs available and Suction available ?Patient Re-evaluated:Patient Re-evaluated prior to induction ?Oxygen Delivery Method: Circle system utilized ?Preoxygenation: Pre-oxygenation with 100% oxygen ?Induction Type: IV induction, Cricoid Pressure applied and Rapid sequence ?Laryngoscope Size: 3 and McGraph ?Grade View: Grade I ?Tube type: Oral ?Tube size: 7.0 mm ?Number of attempts: 1 ?Airway Equipment and Method: Stylet and Video-laryngoscopy ?Placement Confirmation: ETT inserted through vocal cords under direct vision, positive ETCO2 and breath sounds checked- equal and bilateral ?Secured at: 21 cm ?Tube secured with: Tape ?Dental Injury: Teeth and Oropharynx as per pre-operative assessment  ? ? ? ? ?

## 2021-06-20 NOTE — Telephone Encounter (Signed)
Spoke with patient and informed of lab results and Dr. Collie Siad recommendation. Patient verbalized understanding.  ?

## 2021-06-20 NOTE — H&P (Signed)
SURGICAL HISTORY AND PHYSICAL NOTE   HISTORY OF PRESENT ILLNESS (HPI):  61 y.o. female presented to [facility] clinic for evaluation of abdominal pain. Patient reports started having abdominal pain yesterday night.  The pain started on the paramedical area.  The pain does radiate to the right lower quadrant.  The pain now localized to the right lower quadrant.  Pain does not radiate to other part of body.  Patient otherwise without aggravating factor is moving abdominal wall like walking and applying pressure.  There has been no alleviating factors.  Patient endorses having unquantified fever and chills.  Patient had labs done that shows leukocytosis of 11,000, hemoglobin of 14.  She also had a CT scan that shows a dilated appendix at 1 cm with a 1 cm appendicolith.  There is associated fluid surrounding the appendix.  I personally evaluated the images.  Surgery is consulted by Dr. Sabra Heck in this context for evaluation and management of acute appendicitis.  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   Hashimoto's thyroiditis    Hypothyroidism    Personal history of radiation therapy 2019   Vitamin D deficiency      PAST SURGICAL HISTORY ([place]):  Past Surgical History:  Procedure Laterality Date   BREAST BIOPSY Left 12/13/2017   affirm stereo biopsy/HIGH-GRADE DUCTAL CARCINOMA IN SITU, COMEDO TYPE    BREAST CYST ASPIRATION Left 02/16/2005   BREAST LUMPECTOMY Left 01/11/2018   HIGH-GRADE DUCTAL CARCINOMA IN SITU, COMEDO TYPE    CERVICAL POLYPECTOMY  03/28/2006   COLONOSCOPY     PARTIAL MASTECTOMY WITH NEEDLE LOCALIZATION Left 01/11/2018   Procedure: PARTIAL MASTECTOMY WITH NEEDLE LOCALIZATION;  Surgeon: Benjamine Sprague, DO;  Location: [facility] ORS;  Service: General;  Laterality: Left;     MEDICATIONS:  Prior to Admission medications   Medication Sig Start Date End Date Taking? Authorizing Provider  ALPRAZolam  Duanne Moron) 0.5 MG tablet Take 0.25 mg by mouth at bedtime as needed for sleep.  09/13/17   [provider]  anastrozole (ARIMIDEX) 1 MG tablet Take 1 tablet (1 mg total) by mouth daily. 06/19/21   Earlie Server, MD  aspirin 81 MG chewable tablet Chew 81 mg by mouth daily.    [provider]  cetirizine (ZYRTEC) 10 MG tablet Take 10 mg by mouth.    [provider]  Cholecalciferol (VITAMIN D3) 2000 units capsule Take 2,000 Units by mouth every evening.     [provider]  etodolac (LODINE) 400 MG tablet Take 400 mg by mouth every evening.  10/05/17   [provider]  levothyroxine (SYNTHROID, LEVOTHROID) 75 MCG tablet Take 75 mcg by mouth daily before breakfast.  07/27/17   [provider]  magnesium oxide (MAG-OX) 400 MG tablet Take by mouth.    [provider]  vitamin B-12 (CYANOCOBALAMIN) 1000 MCG tablet Take 1,000 mcg by mouth every evening.     [provider]  Vitamin E 400 units TABS Take 1 tablet by mouth daily.    [provider]     ALLERGIES:  Allergies  Allergen Reactions   Sulfa Antibiotics Nausea And Vomiting   Chlorhexidine Gluconate Rash     SOCIAL HISTORY:  Social History   Socioeconomic History   Marital status: Single    Spouse name: Not on file   Number of children: Not on file   Years of education: Not on file   Highest education level:  Not on file  Occupational History   Not on file  Tobacco Use   Smoking status: Former    Packs/day: 1.00    Years: 8.00    Pack years: 8.00    Types: Cigarettes    Quit date: 01/03/1988    Years since quitting: 33.4   Smokeless tobacco: Never  Vaping Use   Vaping Use: Never used  Substance and Sexual Activity   Alcohol use: Never   Drug use: Never   Sexual activity: Not on file  Other Topics Concern   Not on file  Social History Narrative   Not on file   Social Determinants of Health   Financial Resource Strain: Not on file  Food Insecurity:  Not on file  Transportation Needs: Not on file  Physical Activity: Not on file  Stress: Not on file  Social Connections: Not on file  Intimate Partner Violence: Not on file      FAMILY HISTORY:  Family History  Problem Relation Age of Onset   Hypertension Mother    Congestive Heart Failure Father    Lung cancer Maternal Uncle    Ovarian cancer Maternal Grandmother        ovarian cancer mets to lung and brain   Esophageal cancer Maternal Uncle    Liver cancer Maternal Uncle    Breast cancer Neg Hx      REVIEW OF SYSTEMS:  Constitutional: denies weight loss, fever, chills, or sweats  Eyes: denies any other vision changes, history of eye injury  ENT: denies sore throat, hearing problems  Respiratory: denies shortness of breath, wheezing  Cardiovascular: denies chest pain, palpitations  Gastrointestinal: positive abdominal pain, nausea and vomiting Genitourinary: denies burning with urination or urinary frequency Musculoskeletal: denies any other joint pains or cramps  Skin: denies any other rashes or skin discolorations  Neurological: denies any other headache, dizziness, weakness  Psychiatric: denies any other depression, anxiety   All other review of systems were negative   VITAL SIGNS:                INTAKE/OUTPUT:  This shift: No intake/output data recorded.  Last 2 shifts: '@IOLAST2SHIFTS'$ @   PHYSICAL EXAM:  Constitutional:  -- Normal body habitus  -- Awake, alert, and oriented x3  Eyes:  -- Pupils equally round and reactive to light  -- No scleral icterus  Ear, nose, and throat:  -- No jugular venous distension  Pulmonary:  -- No crackles  -- Equal breath sounds bilaterally -- Breathing non-labored at rest Cardiovascular:  -- S1, S2 present  -- No pericardial rubs Gastrointestinal:  -- Abdomen soft, tender in the right lower quadrant, non-distended, no guarding or rebound tenderness -- No abdominal masses appreciated, pulsatile or otherwise   Musculoskeletal and Integumentary:  -- Wounds or skin discoloration: None appreciated -- Extremities: B/L UE and LE FROM, hands and feet warm, no edema  Neurologic:  -- Motor function: intact and symmetric -- Sensation: intact and symmetric   Labs:  CBC Latest Ref Rng & Units 06/16/2021 12/16/2020 06/15/2020  WBC 4.0 - 10.5 K/uL 6.1 5.6 6.8  Hemoglobin 12.0 - 15.0 g/dL 14.4 14.5 14.4  Hematocrit 36.0 - 46.0 % 43.2 44.3 43.6  Platelets 150 - 400 K/uL 230 201 239   CMP Latest Ref Rng & Units 06/20/2021 06/16/2021 12/16/2020  Glucose 70 - 99 mg/dL - 90 99  BUN 6 - 20 mg/dL - 17 18  Creatinine 0.44 - 1.00 mg/dL 0.80 0.72 0.82  Sodium 135 - 145  mmol/L - 136 136  Potassium 3.5 - 5.1 mmol/L - 4.2 4.6  Chloride 98 - 111 mmol/L - 104 103  CO2 22 - 32 mmol/L - 27 25  Calcium 8.9 - 10.3 mg/dL - 9.0 8.9  Total Protein 6.5 - 8.1 g/dL - 7.1 7.0  Total Bilirubin 0.3 - 1.2 mg/dL - 0.3 0.5  Alkaline Phos 38 - 126 U/L - 47 47  AST 15 - 41 U/L - 22 20  ALT 0 - 44 U/L - 20 20    Imaging studies:  EXAM: CT ABDOMEN AND PELVIS WITH CONTRAST   TECHNIQUE: Multidetector CT imaging of the abdomen and pelvis was performed using the standard protocol following bolus administration of intravenous contrast.   RADIATION DOSE REDUCTION: This exam was performed according to the departmental dose-optimization program which includes automated exposure control, adjustment of the mA and/or kV according to patient size and/or use of iterative reconstruction technique.   CONTRAST:  119m OMNIPAQUE IOHEXOL 300 MG/ML  SOLN   COMPARISON:  None.   FINDINGS: Lower chest: No acute abnormality.   Hepatobiliary: No focal liver abnormality is seen. No gallstones, gallbladder wall thickening, or biliary dilatation.   Pancreas: Unremarkable. No pancreatic ductal dilatation or surrounding inflammatory changes.   Spleen: Normal in size without focal abnormality.   Adrenals/Urinary Tract: Adrenal glands are  unremarkable. Subcentimeter low-density lesion in the left kidney is too small to characterize. No renal calculi or hydronephrosis. Bladder is unremarkable.   Stomach/Bowel: Dilated appendix measuring 1.0 cm in diameter mild surrounding inflammatory change and 1.0 cm appendicolith at the base. The stomach and small bowel are unremarkable. Moderate to severe sigmoid colonic diverticulosis.   Vascular/Lymphatic: Aortic atherosclerosis. No enlarged abdominal or pelvic lymph nodes.   Reproductive: Uterus and bilateral adnexa are unremarkable.   Other: Trace free fluid in the pelvis. No fluid collection or pneumoperitoneum.   Musculoskeletal: No acute or significant osseous findings.   IMPRESSION: 1. Acute uncomplicated appendicitis. 2. Aortic Atherosclerosis (ICD10-I70.0).   These results will be called to the ordering clinician or representative by the Radiologist Assistant, and communication documented in the PACS or [employer]     Electronically Signed   By: WTitus DubinM.D.   On: 06/20/2021 12:08  Assessment/Plan:  61y.o. female with acute appendicitis.  Patient with history, physical exam and images consistent with acute appendicitis.  Patient is pretty tender in the right lower quadrant on palpation.  Also with significant dilation of the appendix up to 1 cm with appendicolith and periappendiceal fluid.  This might be concerning of early perforation.  Patient needs emergent appendectomy.    Patient oriented about diagnosis and surgical management as treatment. Patient oriented about goals of surgery and its risk including: bowel injury, infection, abscess, bleeding, leak from cecum, intestinal adhesions, bowel obstruction, fistula, injury to the ureter among others.   Patient understood and agreed to proceed with surgery.    EArnold Long MD

## 2021-06-20 NOTE — Discharge Instructions (Addendum)
?  Diet: Resume home heart healthy regular diet.  ? ?Activity: No heavy lifting >20 pounds (children, pets, laundry, garbage) or strenuous activity for at least one week, but light activity and walking are encouraged. Do not drive or drink alcohol if taking narcotic pain medications. ? ?Wound care: May shower with soapy water and pat dry (do not rub incisions), but no baths or submerging incision underwater until follow-up. (no swimming)  ? ?Medications: Resume all home medications. For mild to moderate pain: acetaminophen (Tylenol) or ibuprofen (if no kidney disease). Combining Tylenol with alcohol can substantially increase your risk of causing liver disease. Narcotic pain medications, if prescribed, can be used for severe pain, though may cause nausea, constipation, and drowsiness. If you do not need the narcotic pain medication, you do not need to fill the prescription. ? ?Call office [phone]) at any time if any questions, worsening pain, fevers/chills, bleeding, drainage from incision site, or other concerns. ? ?AMBULATORY SURGERY  ?DISCHARGE INSTRUCTIONS ? ? ?The drugs that you were given will stay in your system until tomorrow so for the next 24 hours you should not: ? ?Drive an automobile ?Make any legal decisions ?Drink any alcoholic beverage ? ? ?You may resume regular meals tomorrow.  Today it is better to start with liquids and gradually work up to solid foods. ? ?You may eat anything you prefer, but it is better to start with liquids, then soup and crackers, and gradually work up to solid foods. ? ? ?Please notify your doctor immediately if you have any unusual bleeding, trouble breathing, redness and pain at the surgery site, drainage, fever, or pain not relieved by medication. ? ? ? ?Additional Instructions: ? ? ? ?Please contact your physician with any problems or Same Day Surgery at [phone], Monday through Friday 6 am to 4 pm, or [place] [facility] number at [phone].  ?

## 2021-06-20 NOTE — Transfer of Care (Signed)
Immediate Anesthesia Transfer of Care Note ? ?Patient: Victoria Hammond ? ?Procedure(s) Performed: XI ROBOTIC LAPAROSCOPIC ASSISTED APPENDECTOMY ? ?Patient Location: PACU ? ?Anesthesia Type:General ? ?Level of Consciousness: awake, alert  and oriented ? ?Airway & Oxygen Therapy: Patient Spontanous Breathing and Patient connected to face mask oxygen ? ?Post-op Assessment: Report given to RN and Post -op Vital signs reviewed and stable ? ?Post vital signs: Reviewed and stable ? ?Last Vitals:  ?Vitals Value Taken Time  ?BP 111/59 06/20/21 1549  ?Temp    ?Pulse 93 06/20/21 1552  ?Resp 17 06/20/21 1552  ?SpO2 100 % 06/20/21 1552  ?Vitals shown include unvalidated device data. ? ?Last Pain:  ?Vitals:  ? 06/20/21 1345  ?TempSrc: Temporal  ?PainSc: 8   ?   ? ?  ? ?Complications: No notable events documented. ?

## 2021-06-20 NOTE — Anesthesia Preprocedure Evaluation (Addendum)
Anesthesia Evaluation  ?Patient identified by MRN, date of birth, ID band ?Patient awake ? ? ? ?Reviewed: ?Allergy & Precautions, H&P , NPO status , Patient's Chart, lab work & pertinent test results ? ?History of Anesthesia Complications ?Negative for: history of anesthetic complications ? ?Airway ?Mallampati: III ? ?TM Distance: >3 FB ?Neck ROM: full ? ? ? Dental ? ?(+) Teeth Intact, Dental Advidsory Given ?  ?Pulmonary ?neg pulmonary ROS, former smoker,  ?  ?breath sounds clear to auscultation ? ? ? ? ? ? Cardiovascular ?negative cardio ROS ? ? ?Rhythm:regular Rate:Normal ? ? ?  ?Neuro/Psych ?negative neurological ROS ? negative psych ROS  ? GI/Hepatic ?negative GI ROS, Neg liver ROS,   ?Endo/Other  ?neg diabetesHypothyroidism  ? Renal/GU ?  ? ?  ?Musculoskeletal ? ? Abdominal ?  ?Peds ? Hematology ?negative hematology ROS ?(+)   ?Anesthesia Other Findings ?Past Medical History: ?12/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 ?No date: Hashimoto's thyroiditis ?No date: Hypothyroidism ?No date: Vitamin D deficiency ? ?Past Surgical History: ?12/13/2017: BREAST BIOPSY; Left ?    Comment:  affirm stereo biopsy/HIGH-GRADE DUCTAL CARCINOMA IN  ?             SITU, COMEDO TYPE  ?02/16/2005: BREAST CYST ASPIRATION; Left ?01/11/2018: BREAST LUMPECTOMY; Left ?    Comment:  HIGH-GRADE DUCTAL CARCINOMA IN SITU, COMEDO TYPE  ?03/28/2006: CERVICAL POLYPECTOMY ?No date: COLONOSCOPY ? ?Patient last ate crackers and sprite 2.5 hours ago.  Given the emergent nature of the procedure we will proceed at this time and plan for an RSI with an ETT. ? ? Reproductive/Obstetrics ?negative OB ROS ? ?  ? ? ? ? ? ? ? ? ? ? ? ? ? ?  ?  ? ? ? ? ? ? ? ?Anesthesia Physical ? ?Anesthesia Plan ? ?ASA: 2 and emergent ? ?Anesthesia Plan: General  ? ?Post-op Pain Management:   ? ?Induction: Intravenous, Rapid sequence and Cricoid pressure planned ? ?PONV Risk Score and Plan: Ondansetron, Dexamethasone,  Aprepitant, Midazolam and Treatment may vary due to age or medical condition ? ?Airway Management Planned: Oral ETT ? ?Additional Equipment:  ? ?Intra-op Plan:  ? ?Post-operative Plan: Extubation in OR ? ?Informed Consent: I have reviewed the patients History and Physical, chart, labs and discussed the procedure including the risks, benefits and alternatives for the proposed anesthesia with the patient or authorized representative who has indicated his/her understanding and acceptance.  ? ? ? ?Dental Advisory Given ? ?Plan Discussed with: Anesthesiologist, CRNA and Surgeon ? ?Anesthesia Plan Comments:   ? ? ? ? ? ?Anesthesia Quick Evaluation ? ?

## 2021-06-20 NOTE — Telephone Encounter (Signed)
-----   Message from Earlie Server, MD sent at 06/19/2021  3:41 PM EST ----- ?Let patient know that blood work confirmed that she is post menopausal.  I recommend her to stop Tamoxifen and switch to Arimidex. Rx will be sent to her pharmacy.  ?

## 2021-06-20 NOTE — Op Note (Signed)
Pre-op Diagnosis: Acute appendicitis  ? ?Post op Diagnosis: Acute appenditicis ? ?Procedure: Robotic assisted laparoscopic appendectomy. ? ?Anesthesia: GETA ? ?Surgeon: Herbert Pun, MD, FACS ? ?Wound Classification: clean contaminated ? ?Specimen: Appendix ? ?Complications: None ? ?Estimated Blood Loss: 3 mL ? ? ?Indications: Patient is a 61 y.o. female  presented with above right lower quadrant pain. CT scan shows acute appendicitis.    ? ?FIndings: ?1.  Irritated appendix with appendicolith ?2. No peri-appendiceal abscess or phlegmon ?3. Normal anatomy ?4. Adequate hemostasis.  ? ?Description of procedure: The patient was placed on the operating table in the supine position. General anesthesia was induced. A time-out was completed verifying correct patient, procedure, site, positioning, and implant(s) and/or special equipment prior to beginning this procedure. The abdomen was prepped and draped in the usual sterile fashion.  ? ?Palmer's point located and Veress needle was inserted.  After confirming 2 clicks and a positive saline drop test, gas insufflation was initiated until the abdominal pressure was measured at 15 mmHg.  Afterwards, the Veress needle was removed and a 8 mm port was placed in left upper quadrant area using Optiview technique.  After local was infused, 3 additional incision on the left abdominal wall were made 5 cm apart.  An 12 mm port and two other 8 mm ports were placed under direct visualization.  No injuries from trocar placements were noted.  The table was placed in the Trendelenburg position with the right side elevated.  With the use of Tip up grasper, Force Bipolar and Scissors, an inflamed appendix was identified and elevated.  Window created at base of appendix in the mesentery.   ? ?The mesoappendix was divided with bipolar energy and energy scissors.  The base of the appendix was ligated with 3 oh V-Loc pursestring.  The appendix was divided with scissors.  The appendiceal  stump was imbricated with second pursestring. ? ?The appendiceal stump was examined and hemostasis noted. No other pathology was identified within pelvis. The 12 mm trocar removed and port site closed with PMI using 0 vicryl under direct vision. Remaining trocars were removed under direct vision. No bleeding was noted.The abdomen was allowed to collapse.  All skin incisions then closed with subcuticular sutures Monocryl 4-0.  Wounds then dressed with dermabond. ? ?The patient tolerated the procedure well, awakened from anesthesia and was taken to the postanesthesia care unit in satisfactory condition.  Sponge count and instrument count correct at the end of the procedure. ? ?

## 2021-06-21 ENCOUNTER — Encounter: Payer: Self-pay | Admitting: General Surgery

## 2021-06-21 NOTE — Anesthesia Postprocedure Evaluation (Signed)
Anesthesia Post Note ? ?Patient: Victoria Hammond ? ?Procedure(s) Performed: XI ROBOTIC LAPAROSCOPIC ASSISTED APPENDECTOMY ? ?Patient location during evaluation: PACU ?Anesthesia Type: General ?Level of consciousness: awake and alert ?Pain management: pain level controlled ?Vital Signs Assessment: post-procedure vital signs reviewed and stable ?Respiratory status: spontaneous breathing, nonlabored ventilation, respiratory function stable and patient connected to nasal cannula oxygen ?Cardiovascular status: blood pressure returned to baseline and stable ?Postop Assessment: no apparent nausea or vomiting ?Anesthetic complications: no ? ? ?No notable events documented. ? ? ?Last Vitals:  ?Vitals:  ? 06/20/21 1645 06/20/21 1702  ?BP: 111/69 126/72  ?Pulse: 87 87  ?Resp: 14 16  ?Temp: 36.5 ?C 37 ?C  ?SpO2: 97% 100%  ?  ?Last Pain:  ?Vitals:  ? 06/20/21 1702  ?TempSrc: Temporal  ?PainSc: 5   ? ? ?  ?  ?  ?  ?  ?  ? ?Martha Clan ? ? ? ? ?

## 2021-06-22 LAB — SURGICAL PATHOLOGY

## 2021-07-27 ENCOUNTER — Other Ambulatory Visit: Payer: Self-pay | Admitting: Oncology

## 2021-08-02 ENCOUNTER — Ambulatory Visit
Admission: RE | Admit: 2021-08-02 | Discharge: 2021-08-02 | Disposition: A | Discharge status: Home / Self Care | Payer: BC Managed Care – PPO | Source: Ambulatory Visit | Attending: Oncology | Admitting: Oncology

## 2021-08-02 DIAGNOSIS — D0512 Intraductal carcinoma in situ of left breast: Secondary | ICD-10-CM | POA: Insufficient documentation

## 2021-10-27 ENCOUNTER — Telehealth: Payer: Self-pay | Admitting: Oncology

## 2021-10-27 NOTE — Telephone Encounter (Signed)
pt called in to have appt r/s

## 2021-12-02 ENCOUNTER — Ambulatory Visit
Admission: RE | Admit: 2021-12-02 | Discharge: 2021-12-02 | Disposition: A | Discharge status: Home / Self Care | Payer: BC Managed Care – PPO | Source: Ambulatory Visit | Attending: Oncology | Admitting: Oncology

## 2021-12-02 DIAGNOSIS — D0512 Intraductal carcinoma in situ of left breast: Secondary | ICD-10-CM | POA: Insufficient documentation

## 2021-12-02 DIAGNOSIS — Z1231 Encounter for screening mammogram for malignant neoplasm of breast: Secondary | ICD-10-CM | POA: Diagnosis not present

## 2021-12-02 HISTORY — DX: Malignant neoplasm of unspecified site of unspecified female breast: C50.919

## 2021-12-15 ENCOUNTER — Other Ambulatory Visit: Payer: BC Managed Care – PPO

## 2021-12-15 ENCOUNTER — Ambulatory Visit: Payer: BC Managed Care – PPO | Admitting: Oncology

## 2021-12-15 ENCOUNTER — Other Ambulatory Visit: Payer: Self-pay | Admitting: Oncology

## 2021-12-22 ENCOUNTER — Other Ambulatory Visit: Payer: BC Managed Care – PPO

## 2021-12-22 ENCOUNTER — Ambulatory Visit: Payer: BC Managed Care – PPO | Admitting: Oncology

## 2022-01-04 ENCOUNTER — Inpatient Hospital Stay: Payer: BC Managed Care – PPO

## 2022-01-04 ENCOUNTER — Inpatient Hospital Stay: Payer: BC Managed Care – PPO | Admitting: Oncology

## 2022-01-27 ENCOUNTER — Inpatient Hospital Stay: Payer: BC Managed Care – PPO

## 2022-01-27 ENCOUNTER — Inpatient Hospital Stay: Payer: BC Managed Care – PPO | Admitting: Oncology

## 2022-03-07 ENCOUNTER — Inpatient Hospital Stay: Payer: BC Managed Care – PPO | Attending: Oncology

## 2022-03-07 ENCOUNTER — Encounter: Payer: Self-pay | Admitting: Oncology

## 2022-03-07 ENCOUNTER — Inpatient Hospital Stay (HOSPITAL_BASED_OUTPATIENT_CLINIC_OR_DEPARTMENT_OTHER): Payer: BC Managed Care – PPO | Admitting: Oncology

## 2022-03-07 VITALS — BP 165/87 | HR 74 | Temp 97.4°F | Wt 167.6 lb

## 2022-03-07 DIAGNOSIS — D751 Secondary polycythemia: Secondary | ICD-10-CM | POA: Insufficient documentation

## 2022-03-07 DIAGNOSIS — D0512 Intraductal carcinoma in situ of left breast: Secondary | ICD-10-CM | POA: Insufficient documentation

## 2022-03-07 DIAGNOSIS — Z170 Estrogen receptor positive status [ER+]: Secondary | ICD-10-CM | POA: Insufficient documentation

## 2022-03-07 DIAGNOSIS — Z79811 Long term (current) use of aromatase inhibitors: Secondary | ICD-10-CM | POA: Insufficient documentation

## 2022-03-07 LAB — COMPREHENSIVE METABOLIC PANEL
ALT: 20 U/L (ref 0–44)
AST: 21 U/L (ref 15–41)
Albumin: 4.1 g/dL (ref 3.5–5.0)
Alkaline Phosphatase: 73 U/L (ref 38–126)
Anion gap: 5 (ref 5–15)
BUN: 21 mg/dL (ref 8–23)
CO2: 26 mmol/L (ref 22–32)
Calcium: 9.3 mg/dL (ref 8.9–10.3)
Chloride: 105 mmol/L (ref 98–111)
Creatinine, Ser: 0.8 mg/dL (ref 0.44–1.00)
GFR, Estimated: >60 mL/min (ref >60)
Glucose, Bld: 91 mg/dL (ref 70–99)
Potassium: 4.6 mmol/L (ref 3.5–5.1)
Sodium: 136 mmol/L (ref 135–145)
Total Bilirubin: 0.7 mg/dL (ref 0.3–1.2)
Total Protein: 7.5 g/dL (ref 6.5–8.1)

## 2022-03-07 LAB — CBC WITH DIFFERENTIAL/PLATELET
Abs Immature Granulocytes: 0.01 10*3/uL (ref 0.00–0.07)
Basophils Absolute: 0 10*3/uL (ref 0.0–0.1)
Basophils Relative: 1 %
Eosinophils Absolute: 0.3 10*3/uL (ref 0.0–0.5)
Eosinophils Relative: 6 %
HCT: 45.2 % (ref 36.0–46.0)
Hemoglobin: 15.1 g/dL — ABNORMAL HIGH (ref 12.0–15.0)
Immature Granulocytes: 0 %
Lymphocytes Relative: 31 %
Lymphs Abs: 1.9 10*3/uL (ref 0.7–4.0)
MCH: 28.7 pg (ref 26.0–34.0)
MCHC: 33.4 g/dL (ref 30.0–36.0)
MCV: 85.9 fL (ref 80.0–100.0)
Monocytes Absolute: 0.4 10*3/uL (ref 0.1–1.0)
Monocytes Relative: 7 %
Neutro Abs: 3.3 10*3/uL (ref 1.7–7.7)
Neutrophils Relative %: 55 %
Platelets: 250 10*3/uL (ref 150–400)
RBC: 5.26 MIL/uL — ABNORMAL HIGH (ref 3.87–5.11)
RDW: 12.3 % (ref 11.5–15.5)
WBC: 6 10*3/uL (ref 4.0–10.5)
nRBC: 0 % (ref 0.0–0.2)

## 2022-03-07 MED ORDER — ANASTROZOLE 1 MG PO TABS: 1.0000 mg | ORAL_TABLET | Freq: Every day | ORAL | 3 refills | Status: DC

## 2022-03-07 NOTE — Assessment & Plan Note (Signed)
DEXA shows normal bone density.  Repeat in 2 years. Recommend calcium and vitamin D supplementation.

## 2022-03-07 NOTE — Progress Notes (Signed)
Patient is here for follow-up of breast cancer. Patient had mammogram on 12/02/21. 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 Evorn Gong.

## 2022-03-07 NOTE — Progress Notes (Signed)
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 Previously on tamoxifen, switched to Arimidex since last visit. Overall she tolerates well with manageable side effects plan total 5 years of antiestrogen treatments. Continue annual screening mammogram.  Aromatase inhibitor use DEXA shows normal bone density.  Repeat in 2 years. Recommend calcium and vitamin D supplementation.  Erythrocytosis Very mild.  She is a non-smoker.  Possibly due to hemoconcentration/dehydration encourage oral hydration.  Will repeat testing in 6 months.  If persistently elevated, will initiate erythrocytosis workup.  Orders Placed This Encounter  Procedures   CBC with Differential/Platelet    Standing Status:   Future    Standing Expiration Date:   03/07/2023   Comprehensive metabolic panel    Standing Status:   Future    Standing Expiration Date:   03/07/2023   Follow-up in 6 months. All questions were answered. The patient knows to call the clinic with any problems, questions or concerns.  Earlie Server, MD, PhD [facility] Hematology Oncology 03/07/2022    REASON FOR VISIT Follow up for treatment of high grade DCIS  HISTORY OF PRESENTING ILLNESS:  Victoria Hammond is a  61 y.o.  female with PMH listed below who was referred to me for evaluation of  Patient had screening mammogram on 11/22/2017 which showed suspicious calcifications.  She underwent digital diagnostic mammogram on 12/05/2017 which confirmed grouped fine pleomorphic calcifications within the upper outer quadrant of the left breast.  Spanning 8 mm.  Patient underwent stereotactic biopsy of left breast calcification. Biopsy pathology showed:  High-grade ductal carcinoma in situ, comedo type with associated calcifications.  Nipple discharge: Denies Family history: Maternal grandmother deceased from lung and the brain cancer, maternal uncle deceased from lung cancer.  Maternal uncle deceased from esophageal cancer.  Another maternal uncle deceased from liver cancer.  Negative for breast cancer family history.  Patient declined genetic counseling  OCP use: used OCP between age of [age].  Estrogen and progesterone therapy: denies History of radiation to chest: denies.  Previous breast surgery: History of breast cyst aspiration.  01/11/2018 left breast lumpectomy by Dr. Lysle Pearl.  Pathology showed high-grade DCIS, comedo type with associated calcifications. Size of DCIS at least 4 mm, grade 3, comedo necrosis, margins not involved by DCIS, distance from closest margin 8 mm. pTis pNx Family history of ovarian cancer, lung cancer, esophageal cancer, liver cancer, patient declined genetic counseling.  # finished adjuvant radiation on 12/31/ 2019 # January 2020-started tamoxifen 20 mg daily  06/17/2021 switched to Arimidex 08/02/21, DEXA showed normal bone density.  INTERVAL HISTORY Victoria Hammond is a 61 y.o. female who has above history reviewed by me today presents for evaluation of follow-up of DCIS She history of left breast high-grade DCIS, ER weakly positive[ 1-10%}. S/p lumpectomy and radiation.  Patient is currently on Arimidex, she tolerates well.  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  She follows up with dermatologist.  Review of Systems  Constitutional:  Negative for chills, fever, malaise/fatigue and weight loss.  HENT:  Negative for nosebleeds and sore throat.   Eyes:  Negative for double vision, photophobia and redness.  Respiratory:  Negative for cough, shortness of breath and wheezing.   Cardiovascular:  Negative for chest pain,  palpitations, orthopnea and leg swelling.  Gastrointestinal:  Negative for abdominal pain, blood in stool, nausea and vomiting.  Genitourinary:  Negative for dysuria.  Musculoskeletal:  Negative for back pain, myalgias and neck pain.  Skin:  Negative for itching and rash.  Neurological:  Negative for dizziness, tingling and tremors.  Endo/Heme/Allergies:  Negative for environmental allergies. Does not bruise/bleed easily.  Psychiatric/Behavioral:  Negative for depression and hallucinations. The patient is not nervous/anxious.     MEDICAL HISTORY:  Past Medical History:  Diagnosis Date   Breast cancer ([place])    Ductal carcinoma in situ (DCIS) of left breast 12/2017   Hashimoto's thyroiditis    Hypothyroidism    Personal history of radiation therapy 2019   Vitamin D deficiency     SURGICAL HISTORY: Past Surgical History:  Procedure Laterality Date   BREAST BIOPSY Left 12/13/2017   affirm stereo biopsy/HIGH-GRADE DUCTAL CARCINOMA IN SITU, COMEDO TYPE    BREAST CYST ASPIRATION Left 02/16/2005   BREAST LUMPECTOMY Left 01/11/2018   HIGH-GRADE DUCTAL CARCINOMA IN SITU, COMEDO TYPE    CERVICAL POLYPECTOMY  03/28/2006   COLONOSCOPY     PARTIAL MASTECTOMY WITH NEEDLE LOCALIZATION Left 01/11/2018   Procedure: PARTIAL MASTECTOMY WITH NEEDLE LOCALIZATION;  Surgeon: Benjamine Sprague, DO;  Location: [facility] ORS;  Service: General;  Laterality: Left;   XI ROBOTIC LAPAROSCOPIC ASSISTED APPENDECTOMY N/A 06/20/2021   Procedure: XI ROBOTIC LAPAROSCOPIC ASSISTED APPENDECTOMY;  Surgeon: Herbert Pun, MD;  Location: [facility] ORS;  Service: General;  Laterality: N/A;    SOCIAL HISTORY: Social History   Socioeconomic History   Marital status: Single    Spouse name: Not on file   Number of children: Not on file   Years of education: Not on file   Highest education level: Not on file  Occupational History   Not on file  Tobacco Use   Smoking status: Former    Packs/day: 1.00    Years: 8.00     Total pack years: 8.00    Types: Cigarettes    Quit date: 01/03/1988    Years since quitting: 34.1   Smokeless tobacco: Never  Vaping Use   Vaping Use: Never used  Substance and Sexual Activity   Alcohol use: Never   Drug use: Never   Sexual activity: Not on file  Other Topics Concern   Not on file  Social History Narrative   Not on file   Social Determinants of Health   Financial Resource Strain: Not on file  Food Insecurity: Not on file  Transportation Needs: Not on file  Physical Activity: Not on file  Stress: Not on file  Social Connections: Not on file  Intimate Partner Violence: Not on file    FAMILY HISTORY: Family History  Problem Relation Age of Onset   Hypertension Mother    Congestive Heart Failure Father    Lung cancer Maternal Uncle    Ovarian cancer Maternal Grandmother        ovarian cancer mets to lung and brain   Esophageal cancer Maternal Uncle    Liver cancer Maternal Uncle    Breast cancer Neg Hx     ALLERGIES:  is allergic to sulfa antibiotics.  MEDICATIONS:  Current Outpatient Medications  Medication Sig Dispense Refill   ALPRAZolam (XANAX) 0.5 MG tablet Take 0.25 mg by mouth at bedtime as needed for sleep.      anastrozole (ARIMIDEX) 1 MG tablet  TAKE 1 TABLET BY MOUTH EVERY DAY 90 tablet 1   cetirizine (ZYRTEC) 10 MG tablet Take 10 mg by mouth.     Cholecalciferol (VITAMIN D3) 2000 units capsule Take 2,000 Units by mouth every evening.      etodolac (LODINE) 400 MG tablet Take 400 mg by mouth every evening.   3   levothyroxine (SYNTHROID, LEVOTHROID) 75 MCG tablet Take 75 mcg by mouth daily before breakfast.      magnesium oxide (MAG-OX) 400 MG tablet Take by mouth.     vitamin B-12 (CYANOCOBALAMIN) 1000 MCG tablet Take 1,000 mcg by mouth every evening.      Vitamin E 400 units TABS Take 1 tablet by mouth daily.     oxyCODONE-acetaminophen (PERCOCET) 5-325 MG tablet Take 1 tablet by mouth every 4 (four) hours as needed for severe pain.  (Patient not taking: Reported on 03/07/2022) 20 tablet 0   No current facility-administered medications for this visit.     PHYSICAL EXAMINATION: ECOG PERFORMANCE STATUS: 0 - Asymptomatic Vitals:   03/07/22 0941  BP: (!) 165/87  Pulse: 74  Temp: (!) 97.4 F (36.3 C)  SpO2: 100%   Filed Weights   03/07/22 0941  Weight: 167 lb 9.6 oz (76 kg)    Physical Exam Constitutional:      General: She is not in acute distress. HENT:     Head: Normocephalic and atraumatic.  Eyes:     General: No scleral icterus.    Pupils: Pupils are equal, round, and reactive to light.  Cardiovascular:     Rate and Rhythm: Normal rate and regular rhythm.     Heart sounds: Normal heart sounds.  Pulmonary:     Effort: Pulmonary effort is normal. No respiratory distress.     Breath sounds: No wheezing.  Abdominal:     General: Bowel sounds are normal. There is no distension.     Palpations: Abdomen is soft. There is no mass.     Tenderness: There is no abdominal tenderness.  Musculoskeletal:        General: No deformity. Normal range of motion.     Cervical back: Normal range of motion and neck supple.  Skin:    General: Skin is warm and dry.     Findings: No erythema or rash.  Neurological:     Mental Status: She is alert and oriented to person, place, and time. Mental status is at baseline.     Cranial Nerves: No cranial nerve deficit.     Coordination: Coordination normal.  Psychiatric:        Mood and Affect: Mood normal.        Behavior: Behavior normal.        Thought Content: Thought content normal.      LABORATORY DATA:  I have reviewed the data as listed    Latest Ref Rng & Units 03/07/2022    9:32 AM 06/16/2021    9:30 AM 12/16/2020    8:00 AM  CBC  WBC 4.0 - 10.5 K/uL 6.0  6.1  5.6   Hemoglobin 12.0 - 15.0 g/dL 15.1  14.4  14.5   Hematocrit 36.0 - 46.0 % 45.2  43.2  44.3   Platelets 150 - 400 K/uL 250  230  201       Latest Ref Rng & Units 03/07/2022    9:32 AM 06/20/2021    11:33 AM 06/16/2021    9:30 AM  CMP  Glucose 70 - 99 mg/dL 91  90   BUN 8 - 23 mg/dL 21   17   Creatinine 0.44 - 1.00 mg/dL 0.80  0.80  0.72   Sodium 135 - 145 mmol/L 136   136   Potassium 3.5 - 5.1 mmol/L 4.6   4.2   Chloride 98 - 111 mmol/L 105   104   CO2 22 - 32 mmol/L 26   27   Calcium 8.9 - 10.3 mg/dL 9.3   9.0   Total Protein 6.5 - 8.1 g/dL 7.5   7.1   Total Bilirubin 0.3 - 1.2 mg/dL 0.7   0.3   Alkaline Phos 38 - 126 U/L 73   47   AST 15 - 41 U/L 21   22   ALT 0 - 44 U/L 20   20

## 2022-03-07 NOTE — Assessment & Plan Note (Signed)
Very mild.  She is a non-smoker.  Possibly due to hemoconcentration/dehydration encourage oral hydration.  Will repeat testing in 6 months.  If persistently elevated, will initiate erythrocytosis workup.

## 2022-03-07 NOTE — Assessment & Plan Note (Addendum)
ER weakly positive DCIS Magnitude of preventive effects from tamoxifen in weakly ER positive DCIS may be lower. Previously on tamoxifen, switched to Arimidex since last visit. Overall she tolerates well with manageable side effects plan total 5 years of antiestrogen treatments. Continue annual screening mammogram.

## 2022-09-05 ENCOUNTER — Inpatient Hospital Stay: Payer: 59 | Attending: Oncology

## 2022-09-05 ENCOUNTER — Encounter: Payer: Self-pay | Admitting: Oncology

## 2022-09-05 ENCOUNTER — Other Ambulatory Visit: Payer: Self-pay

## 2022-09-05 ENCOUNTER — Other Ambulatory Visit: Payer: BC Managed Care – PPO

## 2022-09-05 ENCOUNTER — Ambulatory Visit: Payer: BC Managed Care – PPO | Admitting: Oncology

## 2022-09-05 ENCOUNTER — Inpatient Hospital Stay (HOSPITAL_BASED_OUTPATIENT_CLINIC_OR_DEPARTMENT_OTHER): Payer: 59 | Admitting: Oncology

## 2022-09-05 VITALS — BP 129/90 | HR 74 | Temp 97.0°F | Resp 18 | Wt 166.4 lb

## 2022-09-05 DIAGNOSIS — D751 Secondary polycythemia: Secondary | ICD-10-CM | POA: Insufficient documentation

## 2022-09-05 DIAGNOSIS — Z79811 Long term (current) use of aromatase inhibitors: Secondary | ICD-10-CM | POA: Diagnosis not present

## 2022-09-05 DIAGNOSIS — Z170 Estrogen receptor positive status [ER+]: Secondary | ICD-10-CM | POA: Insufficient documentation

## 2022-09-05 DIAGNOSIS — D0512 Intraductal carcinoma in situ of left breast: Secondary | ICD-10-CM | POA: Diagnosis present

## 2022-09-05 LAB — CBC WITH DIFFERENTIAL/PLATELET
Abs Immature Granulocytes: 0.02 10*3/uL (ref 0.00–0.07)
Basophils Absolute: 0 10*3/uL (ref 0.0–0.1)
Basophils Relative: 1 %
Eosinophils Absolute: 0.2 10*3/uL (ref 0.0–0.5)
Eosinophils Relative: 4 %
HCT: 46.4 % — ABNORMAL HIGH (ref 36.0–46.0)
Hemoglobin: 15.2 g/dL — ABNORMAL HIGH (ref 12.0–15.0)
Immature Granulocytes: 0 %
Lymphocytes Relative: 31 %
Lymphs Abs: 1.8 10*3/uL (ref 0.7–4.0)
MCH: 28.5 pg (ref 26.0–34.0)
MCHC: 32.8 g/dL (ref 30.0–36.0)
MCV: 87.1 fL (ref 80.0–100.0)
Monocytes Absolute: 0.4 10*3/uL (ref 0.1–1.0)
Monocytes Relative: 7 %
Neutro Abs: 3.2 10*3/uL (ref 1.7–7.7)
Neutrophils Relative %: 57 %
Platelets: 257 10*3/uL (ref 150–400)
RBC: 5.33 MIL/uL — ABNORMAL HIGH (ref 3.87–5.11)
RDW: 12.7 % (ref 11.5–15.5)
WBC: 5.6 10*3/uL (ref 4.0–10.5)
nRBC: 0 % (ref 0.0–0.2)

## 2022-09-05 LAB — COMPREHENSIVE METABOLIC PANEL
ALT: 18 U/L (ref 0–44)
AST: 19 U/L (ref 15–41)
Albumin: 4 g/dL (ref 3.5–5.0)
Alkaline Phosphatase: 70 U/L (ref 38–126)
Anion gap: 12 (ref 5–15)
BUN: 23 mg/dL (ref 8–23)
CO2: 24 mmol/L (ref 22–32)
Calcium: 9.3 mg/dL (ref 8.9–10.3)
Chloride: 101 mmol/L (ref 98–111)
Creatinine, Ser: 0.7 mg/dL (ref 0.44–1.00)
GFR, Estimated: >60 mL/min (ref >60)
Glucose, Bld: 94 mg/dL (ref 70–99)
Potassium: 4.6 mmol/L (ref 3.5–5.1)
Sodium: 137 mmol/L (ref 135–145)
Total Bilirubin: 0.5 mg/dL (ref 0.3–1.2)
Total Protein: 7.3 g/dL (ref 6.5–8.1)

## 2022-09-05 NOTE — Assessment & Plan Note (Addendum)
ER weakly positive DCIS Magnitude of preventive effects from tamoxifen in weakly ER positive DCIS may be lower. Previously on tamoxifen since 04/2018, switched to Arimidex on 06/17/2021 Overall she tolerates well with manageable side effects plan total 5 years of antiestrogen treatments- till 04/2023 Continue annual screening mammogram.

## 2022-09-05 NOTE — Assessment & Plan Note (Signed)
Very mild.  She is a non-smoker.  Recommend patient get sleep study for evaluate sleep apnea.  Check JAK2 V617F mutation negative, with reflex to other mutations CALR, MPL, JAK 2 Ex 12-15 mutations  BCR-ABL1 FISH

## 2022-09-05 NOTE — Progress Notes (Signed)
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 Previously on tamoxifen since 04/2018, switched to Arimidex on 06/17/2021 Overall she tolerates well with manageable side effects plan total 5 years of antiestrogen treatments- till 04/2023 Continue annual screening mammogram.  Erythrocytosis Very mild.  She is a non-smoker.  Recommend patient get sleep study for evaluate sleep apnea.  Check JAK2 V617F mutation negative, with reflex to other mutations CALR, MPL, JAK 2 Ex 12-15 mutations  BCR-ABL1 FISH    Aromatase inhibitor use DEXA shows normal bone density.  Repeat in 2 years - 2025 Recommend calcium and vitamin D supplementation.  Orders Placed This Encounter  Procedures   MM 3D SCREENING MAMMOGRAM BILATERAL BREAST    Standing Status:   Future    Standing Expiration Date:   09/05/2023    Order Specific Question:   Reason for Exam (SYMPTOM  OR DIAGNOSIS REQUIRED)    Answer:   Hx breast cancer    Order Specific Question:   Preferred imaging location?    Answer:   [facility]   JAK2 V617F rfx CALR/MPL/E12-15    Standing Status:   Future    Number of Occurrences:   1    Standing Expiration Date:   09/05/2023   BCR-ABL1 FISH    Standing Status:   Future    Number of Occurrences:   1    Standing Expiration Date:   09/05/2023   Carbon Monoxide, Blood    Standing Status:   Future    Number of Occurrences:   1    Standing Expiration Date:   09/05/2023   Erythropoietin    Standing Status:   Future    Number of Occurrences:   1    Standing Expiration Date:   09/05/2023   CMP (Cancer Center only)    Standing Status:    Future    Standing Expiration Date:   09/05/2023   CBC with Differential (Cancer Center Only)    Standing Status:   Future    Standing Expiration Date:   09/05/2023   Follow-up in 6 months. All questions were answered. The patient knows to call the clinic with any problems, questions or concerns.  Rickard Patience, MD, PhD [facility] Hematology Oncology 09/05/2022      HISTORY OF PRESENTING ILLNESS:  Victoria Hammond is a  62 y.o.  female with PMH listed below who was referred to me for evaluation of  Patient had screening mammogram on 11/22/2017 which showed suspicious calcifications.  She underwent digital diagnostic mammogram on 12/05/2017 which confirmed grouped fine pleomorphic calcifications within the upper outer quadrant of the left breast.  Spanning 8 mm.  Patient underwent stereotactic biopsy of left breast calcification. Biopsy pathology showed: High-grade ductal carcinoma in situ, comedo type with associated calcifications.  Nipple discharge: Denies Family history: Maternal grandmother deceased from lung and the brain cancer, maternal uncle deceased from lung cancer.  Maternal uncle deceased from esophageal cancer.  Another maternal uncle deceased from liver cancer.  Negative for breast cancer family history.  Patient declined genetic counseling  OCP use: used OCP between age of [age].  Estrogen and progesterone therapy:  denies History of radiation to chest: denies.  Previous breast surgery: History of breast cyst aspiration.  01/11/2018 left breast lumpectomy by Dr. Tonna Boehringer.  Pathology showed high-grade DCIS, comedo type with associated calcifications. Size of DCIS at least 4 mm, grade 3, comedo necrosis, margins not involved by DCIS, distance from closest margin 8 mm. pTis pNx Family history of ovarian cancer, lung cancer, esophageal cancer, liver cancer, patient declined genetic counseling.  # finished adjuvant radiation on 12/31/ 2019 # January 2020-started tamoxifen 20 mg daily   06/17/2021 switched to Arimidex 08/02/21, DEXA showed normal bone density.  INTERVAL HISTORY Victoria Hammond is a 62 y.o. female who has above history reviewed by me today presents for evaluation of follow-up of DCIS She history of left breast high-grade DCIS, ER weakly positive[ 1-10%}. S/p lumpectomy and radiation.  Patient is currently on Arimidex, she tolerates well.  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  She follows up with dermatologist.  Review of Systems  Constitutional:  Negative for chills, fever, malaise/fatigue and weight loss.  HENT:  Negative for nosebleeds and sore throat.   Eyes:  Negative for double vision, photophobia and redness.  Respiratory:  Negative for cough, shortness of breath and wheezing.   Cardiovascular:  Negative for chest pain, palpitations, orthopnea and leg swelling.  Gastrointestinal:  Negative for abdominal pain, blood in stool, nausea and vomiting.  Genitourinary:  Negative for dysuria.  Musculoskeletal:  Negative for back pain, myalgias and neck pain.  Skin:  Negative for itching and rash.  Neurological:  Negative for dizziness, tingling and tremors.  Endo/Heme/Allergies:  Negative for environmental allergies. Does not bruise/bleed easily.  Psychiatric/Behavioral:  Negative for depression and hallucinations. The patient is not nervous/anxious.     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   Hashimoto's thyroiditis    Hypothyroidism    Personal history of radiation therapy 2019   Vitamin D deficiency     SURGICAL HISTORY: Past Surgical History:  Procedure Laterality Date   BREAST BIOPSY Left 12/13/2017   affirm stereo biopsy/HIGH-GRADE DUCTAL CARCINOMA IN SITU, COMEDO TYPE    BREAST CYST ASPIRATION Left 02/16/2005   BREAST LUMPECTOMY Left 01/11/2018   HIGH-GRADE  DUCTAL CARCINOMA IN SITU, COMEDO TYPE    CERVICAL POLYPECTOMY  03/28/2006   COLONOSCOPY     PARTIAL MASTECTOMY WITH NEEDLE LOCALIZATION Left 01/11/2018   Procedure: PARTIAL MASTECTOMY WITH NEEDLE LOCALIZATION;  Surgeon: Sung Amabile, DO;  Location: [facility] ORS;  Service: General;  Laterality: Left;   XI ROBOTIC LAPAROSCOPIC ASSISTED APPENDECTOMY N/A 06/20/2021   Procedure: XI ROBOTIC LAPAROSCOPIC ASSISTED APPENDECTOMY;  Surgeon: Carolan Shiver, MD;  Location: [facility] ORS;  Service: General;  Laterality: N/A;    SOCIAL HISTORY: Social History   Socioeconomic History   Marital status: Single    Spouse name: Not on file   Number of children: Not on file   Years of education: Not on file   Highest education level: Not on file  Occupational History   Not on file  Tobacco Use   Smoking status: Former    Packs/day: 1.00    Years: 8.00    Additional pack years: 0.00    Total pack years: 8.00    Types: Cigarettes    Quit date: 01/03/1988    Years since quitting: 34.6   Smokeless tobacco: Never  Vaping Use   Vaping Use: Never used  Substance and Sexual Activity   Alcohol use: Never   Drug use: Never   Sexual activity: Not on file  Other Topics Concern   Not on file  Social History Narrative   Not on file   Social Determinants of Health   Financial Resource Strain: Not on file  Food Insecurity: Not on file  Transportation Needs: Not on file  Physical Activity: Not on file  Stress: Not on file  Social Connections: Not on file  Intimate Partner Violence: Not on file    FAMILY HISTORY: Family History  Problem Relation Age of Onset   Hypertension Mother    Congestive Heart Failure Father    Lung cancer Maternal Uncle    Ovarian cancer Maternal Grandmother        ovarian cancer mets to lung and brain   Esophageal cancer Maternal Uncle    Liver cancer Maternal Uncle    Breast cancer Neg Hx     ALLERGIES:  is allergic to sulfa antibiotics.  MEDICATIONS:  Current  Outpatient Medications  Medication Sig Dispense Refill   ALPRAZolam (XANAX) 0.5 MG tablet Take 0.25 mg by mouth at bedtime as needed for sleep.      anastrozole (ARIMIDEX) 1 MG tablet Take 1 tablet (1 mg total) by mouth daily. 90 tablet 3   cetirizine (ZYRTEC) 10 MG tablet Take 10 mg by mouth.     Cholecalciferol (VITAMIN D3) 2000 units capsule Take 2,000 Units by mouth every evening.      etodolac (LODINE) 400 MG tablet Take 400 mg by mouth every evening.   3   levothyroxine (SYNTHROID, LEVOTHROID) 75 MCG tablet Take 75 mcg by mouth daily before breakfast.      magnesium oxide (MAG-OX) 400 MG tablet Take by mouth.     vitamin B-12 (CYANOCOBALAMIN) 1000 MCG tablet Take 1,000 mcg by mouth every evening.      Vitamin E 400 units TABS Take 1 tablet by mouth daily.     No current facility-administered medications for this visit.     PHYSICAL EXAMINATION: ECOG PERFORMANCE STATUS: 0 - Asymptomatic Vitals:   09/05/22 0935  BP: (!) 129/90  Pulse: 74  Resp: 18  Temp: (!) 97 F (36.1 C)  SpO2: 100%   Filed Weights   09/05/22 0935  Weight: 166 lb 6.4 oz (75.5 kg)    Physical Exam Constitutional:      General: She is not in acute distress. HENT:     Head: Normocephalic and atraumatic.  Eyes:     General: No scleral icterus. Cardiovascular:     Rate and Rhythm: Normal rate and regular rhythm.     Heart sounds: Normal heart sounds.  Pulmonary:     Effort: Pulmonary effort is normal. No respiratory distress.     Breath sounds: No wheezing.  Abdominal:     General: Bowel sounds are normal. There is no distension.     Palpations: Abdomen is soft. There is no mass.     Tenderness: There is no abdominal tenderness.  Musculoskeletal:        General: No deformity. Normal range of motion.     Cervical back: Normal range of motion and neck supple.  Skin:    General: Skin is warm and dry.     Findings: No erythema or rash.  Neurological:     Mental Status: She is alert and oriented to  person, place, and time. Mental status is at baseline.  Cranial Nerves: No cranial nerve deficit.     Coordination: Coordination normal.  Psychiatric:        Mood and Affect: Mood normal.    Breast exam was performed in seated and lying down position. Status post left breast lumpectomy.  No palpable breast mass or lymphadenopathy. No skin fluctuation or erythema.    LABORATORY DATA:  I have reviewed the data as listed    Latest Ref Rng & Units 09/05/2022    9:15 AM 03/07/2022    9:32 AM 06/16/2021    9:30 AM  CBC  WBC 4.0 - 10.5 K/uL 5.6  6.0  6.1   Hemoglobin 12.0 - 15.0 g/dL 16.1  09.6  04.5   Hematocrit 36.0 - 46.0 % 46.4  45.2  43.2   Platelets 150 - 400 K/uL 257  250  230       Latest Ref Rng & Units 09/05/2022    9:15 AM 03/07/2022    9:32 AM 06/20/2021   11:33 AM  CMP  Glucose 70 - 99 mg/dL 94  91    BUN 8 - 23 mg/dL 23  21    Creatinine 4.09 - 1.00 mg/dL 8.11  9.14  7.82   Sodium 135 - 145 mmol/L 137  136    Potassium 3.5 - 5.1 mmol/L 4.6  4.6    Chloride 98 - 111 mmol/L 101  105    CO2 22 - 32 mmol/L 24  26    Calcium 8.9 - 10.3 mg/dL 9.3  9.3    Total Protein 6.5 - 8.1 g/dL 7.3  7.5    Total Bilirubin 0.3 - 1.2 mg/dL 0.5  0.7    Alkaline Phos 38 - 126 U/L 70  73    AST 15 - 41 U/L 19  21    ALT 0 - 44 U/L 18  20

## 2022-09-05 NOTE — Assessment & Plan Note (Signed)
DEXA shows normal bone density.  Repeat in 2 years - 2025 Recommend calcium and vitamin D supplementation.

## 2022-09-06 LAB — CARBON MONOXIDE, BLOOD (PERFORMED AT REF LAB): Carbon Monoxide, Blood: 2.3 % (ref 0.0–3.6)

## 2022-09-06 LAB — ERYTHROPOIETIN: Erythropoietin: 4.5 m[IU]/mL (ref 2.6–18.5)

## 2022-09-08 LAB — BCR-ABL1 FISH
Cells Analyzed: 200
Cells Counted: 200

## 2022-09-15 LAB — JAK2 V617F RFX CALR/MPL/E12-15

## 2022-09-15 LAB — CALR +MPL + E12-E15  (REFLEX)

## 2022-12-14 ENCOUNTER — Ambulatory Visit
Admission: RE | Admit: 2022-12-14 | Discharge: 2022-12-14 | Disposition: A | Discharge status: Home / Self Care | Payer: 59 | Source: Ambulatory Visit | Attending: Oncology | Admitting: Oncology

## 2022-12-14 DIAGNOSIS — Z853 Personal history of malignant neoplasm of breast: Secondary | ICD-10-CM | POA: Insufficient documentation

## 2022-12-14 DIAGNOSIS — D0512 Intraductal carcinoma in situ of left breast: Secondary | ICD-10-CM

## 2022-12-14 DIAGNOSIS — Z1231 Encounter for screening mammogram for malignant neoplasm of breast: Secondary | ICD-10-CM | POA: Diagnosis present

## 2023-03-05 ENCOUNTER — Ambulatory Visit: Payer: 59

## 2023-03-05 DIAGNOSIS — K640 First degree hemorrhoids: Secondary | ICD-10-CM | POA: Diagnosis not present

## 2023-03-05 DIAGNOSIS — Z09 Encounter for follow-up examination after completed treatment for conditions other than malignant neoplasm: Secondary | ICD-10-CM | POA: Diagnosis present

## 2023-03-05 DIAGNOSIS — Z860101 Personal history of adenomatous and serrated colon polyps: Secondary | ICD-10-CM | POA: Diagnosis not present

## 2023-03-05 DIAGNOSIS — K5730 Diverticulosis of large intestine without perforation or abscess without bleeding: Secondary | ICD-10-CM | POA: Diagnosis not present

## 2023-03-07 ENCOUNTER — Ambulatory Visit: Payer: 59 | Admitting: Oncology

## 2023-03-07 ENCOUNTER — Other Ambulatory Visit: Payer: 59

## 2023-03-26 ENCOUNTER — Other Ambulatory Visit: Payer: Self-pay | Admitting: Oncology

## 2023-04-19 ENCOUNTER — Other Ambulatory Visit: Payer: Self-pay | Admitting: Oncology

## 2023-05-03 ENCOUNTER — Other Ambulatory Visit: Payer: Self-pay | Admitting: Oncology

## 2023-05-03 ENCOUNTER — Telehealth: Payer: Self-pay | Admitting: *Deleted

## 2023-05-03 ENCOUNTER — Encounter: Payer: Self-pay | Admitting: *Deleted

## 2023-05-03 MED ORDER — ANASTROZOLE 1 MG PO TABS: 1.0000 mg | ORAL_TABLET | Freq: Every day | ORAL | 0 refills | Status: AC

## 2023-05-03 NOTE — Telephone Encounter (Signed)
Patient called because she only has 2 more pills of anastrozole.  She will be on the fifth year per Dr. Cathie Hoops but she wants to make sure that she need to have another prescription to cover her till the end of January or can she just stopped that the 2 tablets that she has available at this time.

## 2023-05-03 NOTE — Telephone Encounter (Signed)
I called later to the evening because I did not see that Dr. Cathie Hoops said that she put in 15 tablets of anastrozole for. Patient and Dr. Cathie Hoops that would cover to 5th year

## 2023-05-08 NOTE — Assessment & Plan Note (Signed)
ER weakly positive DCIS Magnitude of preventive effects from tamoxifen in weakly ER positive DCIS may be lower. Previously on tamoxifen since 04/2018, switched to Arimidex on 06/17/2021 Overall she tolerates well with manageable side effects plan total 5 years of antiestrogen treatments- till 04/2023 Continue annual screening mammogram.

## 2023-05-09 ENCOUNTER — Encounter: Payer: Self-pay | Admitting: Oncology

## 2023-05-09 ENCOUNTER — Inpatient Hospital Stay: Payer: 59 | Admitting: Oncology

## 2023-05-09 ENCOUNTER — Inpatient Hospital Stay: Payer: 59 | Attending: Oncology

## 2023-05-09 VITALS — BP 148/96 | HR 79 | Temp 97.1°F | Resp 18 | Wt 167.7 lb

## 2023-05-09 DIAGNOSIS — Z9012 Acquired absence of left breast and nipple: Secondary | ICD-10-CM | POA: Insufficient documentation

## 2023-05-09 DIAGNOSIS — D0512 Intraductal carcinoma in situ of left breast: Secondary | ICD-10-CM | POA: Diagnosis present

## 2023-05-09 DIAGNOSIS — Z882 Allergy status to sulfonamides status: Secondary | ICD-10-CM | POA: Insufficient documentation

## 2023-05-09 DIAGNOSIS — Z79810 Long term (current) use of selective estrogen receptor modulators (SERMs): Secondary | ICD-10-CM | POA: Diagnosis not present

## 2023-05-09 DIAGNOSIS — Z79811 Long term (current) use of aromatase inhibitors: Secondary | ICD-10-CM | POA: Diagnosis not present

## 2023-05-09 LAB — CMP (CANCER CENTER ONLY)
ALT: 20 U/L (ref 0–44)
AST: 22 U/L (ref 15–41)
Albumin: 4.3 g/dL (ref 3.5–5.0)
Alkaline Phosphatase: 74 U/L (ref 38–126)
Anion gap: 9 (ref 5–15)
BUN: 16 mg/dL (ref 8–23)
CO2: 27 mmol/L (ref 22–32)
Calcium: 9.4 mg/dL (ref 8.9–10.3)
Chloride: 104 mmol/L (ref 98–111)
Creatinine: 0.68 mg/dL (ref 0.44–1.00)
GFR, Estimated: >60 mL/min (ref >60)
Glucose, Bld: 102 mg/dL — ABNORMAL HIGH (ref 70–99)
Potassium: 4.4 mmol/L (ref 3.5–5.1)
Sodium: 140 mmol/L (ref 135–145)
Total Bilirubin: 0.7 mg/dL (ref 0.0–1.2)
Total Protein: 7.5 g/dL (ref 6.5–8.1)

## 2023-05-09 LAB — CBC WITH DIFFERENTIAL (CANCER CENTER ONLY)
Abs Immature Granulocytes: 0.02 10*3/uL (ref 0.00–0.07)
Basophils Absolute: 0 10*3/uL (ref 0.0–0.1)
Basophils Relative: 1 %
Eosinophils Absolute: 0.2 10*3/uL (ref 0.0–0.5)
Eosinophils Relative: 4 %
HCT: 45 % (ref 36.0–46.0)
Hemoglobin: 14.6 g/dL (ref 12.0–15.0)
Immature Granulocytes: 0 %
Lymphocytes Relative: 35 %
Lymphs Abs: 2 10*3/uL (ref 0.7–4.0)
MCH: 27.7 pg (ref 26.0–34.0)
MCHC: 32.4 g/dL (ref 30.0–36.0)
MCV: 85.4 fL (ref 80.0–100.0)
Monocytes Absolute: 0.4 10*3/uL (ref 0.1–1.0)
Monocytes Relative: 7 %
Neutro Abs: 3 10*3/uL (ref 1.7–7.7)
Neutrophils Relative %: 53 %
Platelet Count: 271 10*3/uL (ref 150–400)
RBC: 5.27 MIL/uL — ABNORMAL HIGH (ref 3.87–5.11)
RDW: 12.9 % (ref 11.5–15.5)
WBC Count: 5.7 10*3/uL (ref 4.0–10.5)
nRBC: 0 % (ref 0.0–0.2)

## 2023-05-09 NOTE — Progress Notes (Signed)
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 Previously on tamoxifen since 04/2018, switched to Arimidex on 06/17/2021 Overall she tolerates well with manageable side effects She has finished  total 5 years of antiestrogen treatments-[04/2018- 04/2023], stop Arimidex  Continue annual screening mammogram-Fall 2025 - patient will ask her primary care provider to order.  Recommend patient to repeat a bone density in 2025 She will discharged from oncology clinic today.   All questions were answered. The patient knows to call the clinic with any problems, questions or concerns.  Rickard Patience, MD, PhD [facility] Hematology Oncology 05/09/2023      HISTORY OF PRESENTING ILLNESS:  Victoria Hammond is a  63 y.o.  female with PMH listed below who was referred to me for evaluation of  Patient had screening mammogram on 11/22/2017 which showed suspicious calcifications.  She underwent digital diagnostic mammogram on 12/05/2017 which confirmed grouped fine pleomorphic calcifications within the upper outer quadrant of the left breast.  Spanning 8 mm.  Patient underwent stereotactic biopsy of left breast calcification. Biopsy pathology showed: High-grade ductal carcinoma in situ, comedo type with associated calcifications.  Nipple discharge: Denies Family history: Maternal grandmother deceased from lung and the brain cancer, maternal uncle deceased from lung cancer.  Maternal uncle deceased from esophageal cancer.  Another maternal uncle deceased from liver cancer.  Negative for breast cancer family  history.  Patient declined genetic counseling  OCP use: used OCP between age of [age].  Estrogen and progesterone therapy: denies History of radiation to chest: denies.  Previous breast surgery: History of breast cyst aspiration.  01/11/2018 left breast lumpectomy by Dr. Tonna Boehringer.  Pathology showed high-grade DCIS, comedo type with associated calcifications. Size of DCIS at least 4 mm, grade 3, comedo necrosis, margins not involved by DCIS, distance from closest margin 8 mm. pTis pNx Family history of ovarian cancer, lung cancer, esophageal cancer, liver cancer, patient declined genetic counseling.  # finished adjuvant radiation on 12/31/ 2019 # January 2020-started tamoxifen 20 mg daily  06/17/2021 switched to Arimidex 08/02/21, DEXA showed normal bone density.  INTERVAL HISTORY Victoria Hammond is a 63 y.o. female who has above history reviewed by me today presents for evaluation of follow-up of DCIS She history of left breast high-grade DCIS, ER weakly positive[ 1-10%}. S/p lumpectomy and radiation.  Patient is currently on Arimidex, she tolerates well.  Manageable side effects She has a history of  skin melanoma in situ.  She follows up with dermatologist. She has no new breast concerns today.   Review of Systems  Constitutional:  Negative for chills, fever, malaise/fatigue and weight loss.  HENT:  Negative for nosebleeds and sore throat.   Eyes:  Negative for double vision, photophobia and redness.  Respiratory:  Negative for cough, shortness of breath and wheezing.   Cardiovascular:  Negative for chest pain, palpitations, orthopnea and leg swelling.  Gastrointestinal:  Negative for abdominal pain, blood in stool, nausea and vomiting.  Genitourinary:  Negative for dysuria.  Musculoskeletal:  Negative for back pain, myalgias and neck pain.  Skin:  Negative for itching and rash.  Neurological:  Negative for dizziness, tingling and tremors.  Endo/Heme/Allergies:  Negative for  environmental allergies. Does not bruise/bleed easily.  Psychiatric/Behavioral:  Negative for depression and hallucinations. The patient is not nervous/anxious.     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   Hashimoto's thyroiditis    Hypothyroidism    Personal history of radiation therapy 2019   Vitamin D deficiency     SURGICAL HISTORY: Past Surgical History:  Procedure Laterality Date   BREAST BIOPSY Left 12/13/2017   affirm stereo biopsy/HIGH-GRADE DUCTAL CARCINOMA IN SITU, COMEDO TYPE    BREAST CYST ASPIRATION Left 02/16/2005   BREAST LUMPECTOMY Left 01/11/2018   HIGH-GRADE DUCTAL CARCINOMA IN SITU, COMEDO TYPE    CERVICAL POLYPECTOMY  03/28/2006   COLONOSCOPY     PARTIAL MASTECTOMY WITH NEEDLE LOCALIZATION Left 01/11/2018   Procedure: PARTIAL MASTECTOMY WITH NEEDLE LOCALIZATION;  Surgeon: Sung Amabile, DO;  Location: [facility] ORS;  Service: General;  Laterality: Left;   XI ROBOTIC LAPAROSCOPIC ASSISTED APPENDECTOMY N/A 06/20/2021   Procedure: XI ROBOTIC LAPAROSCOPIC ASSISTED APPENDECTOMY;  Surgeon: Carolan Shiver, MD;  Location: [facility] ORS;  Service: General;  Laterality: N/A;    SOCIAL HISTORY: Social History   Socioeconomic History   Marital status: Single    Spouse name: Not on file   Number of children: Not on file   Years of education: Not on file   Highest education level: Not on file  Occupational History   Not on file  Tobacco Use   Smoking status: Former    Current packs/day: 0.00    Average packs/day: 1 pack/day for 8.0 years (8.0 ttl pk-yrs)    Types: Cigarettes    Start date: 01/03/1980    Quit date: 01/03/1988    Years since quitting: 35.3   Smokeless tobacco: Never  Vaping Use   Vaping status: Never Used  Substance and Sexual Activity   Alcohol use: Never   Drug use: Never   Sexual activity: Not on file  Other Topics Concern   Not on file  Social  History Narrative   Not on file   Social Drivers of Health   Financial Resource Strain: Low Risk  (10/03/2022)   Received from [facility], [facility]   Overall Financial Resource Strain (CARDIA)    Difficulty of Paying Living Expenses: Not hard at all  Food Insecurity: No Food Insecurity (10/03/2022)   Received from [facility], [facility]   Hunger Vital Sign    Worried About Running Out of Food in the Last Year: Never true    Ran Out of Food in the Last Year: Never true  Transportation Needs: No Transportation Needs (10/03/2022)   Received from [facility], [facility]   [facility] - Transportation    In the past 12 months, has lack of transportation kept you from medical appointments or from getting medications?: No    Lack of Transportation (Non-Medical): No  Physical Activity: Not on file  Stress: Not on file  Social Connections: Not on file  Intimate Partner Violence: Not on file    FAMILY HISTORY: Family History  Problem Relation Age of Onset   Hypertension Mother    Congestive Heart Failure Father    Lung cancer Maternal Uncle    Ovarian cancer Maternal Grandmother        ovarian cancer mets  to lung and brain   Esophageal cancer Maternal Uncle    Liver cancer Maternal Uncle    Breast cancer Neg Hx     ALLERGIES:  is allergic to sulfa antibiotics.  MEDICATIONS:  Current Outpatient Medications  Medication Sig Dispense Refill   ALPRAZolam (XANAX) 0.5 MG tablet Take 0.25 mg by mouth at bedtime as needed for sleep.      anastrozole (ARIMIDEX) 1 MG tablet Take 1 tablet (1 mg total) by mouth daily. 15 tablet 0   cetirizine (ZYRTEC) 10 MG tablet Take 10 mg by mouth.     Cholecalciferol (VITAMIN D3) 2000 units capsule Take 2,000 Units by mouth every evening.      etodolac (LODINE) 400 MG tablet Take 400 mg by mouth every evening.   3   levothyroxine (SYNTHROID,  LEVOTHROID) 75 MCG tablet Take 75 mcg by mouth daily before breakfast.      magnesium oxide (MAG-OX) 400 MG tablet Take by mouth.     vitamin B-12 (CYANOCOBALAMIN) 1000 MCG tablet Take 1,000 mcg by mouth every evening.      Vitamin E 400 units TABS Take 1 tablet by mouth daily.     Calcium Carb-Cholecalciferol (CALCIUM HIGH POTENCY/VITAMIN D) 600-5 MG-MCG TABS Take by mouth.     No current facility-administered medications for this visit.     PHYSICAL EXAMINATION: ECOG PERFORMANCE STATUS: 0 - Asymptomatic Vitals:   05/09/23 0941 05/09/23 0944  BP: (!) 162/101 (!) 148/96  Pulse: 79   Resp: 18   Temp: (!) 97.1 F (36.2 C)    Filed Weights   05/09/23 0941  Weight: 167 lb 11.2 oz (76.1 kg)    Physical Exam Constitutional:      General: She is not in acute distress. HENT:     Head: Normocephalic and atraumatic.  Eyes:     General: No scleral icterus. Cardiovascular:     Rate and Rhythm: Normal rate.  Pulmonary:     Effort: Pulmonary effort is normal. No respiratory distress.  Abdominal:     General: There is no distension.  Musculoskeletal:        General: Normal range of motion.     Cervical back: Normal range of motion and neck supple.  Skin:    Findings: No rash.  Neurological:     Mental Status: She is alert and oriented to person, place, and time. Mental status is at baseline.  Psychiatric:        Mood and Affect: Mood normal.      LABORATORY DATA:  I have reviewed the data as listed    Latest Ref Rng & Units 05/09/2023    9:29 AM 09/05/2022    9:15 AM 03/07/2022    9:32 AM  CBC  WBC 4.0 - 10.5 K/uL 5.7  5.6  6.0   Hemoglobin 12.0 - 15.0 g/dL 21.3  08.6  57.8   Hematocrit 36.0 - 46.0 % 45.0  46.4  45.2   Platelets 150 - 400 K/uL 271  257  250       Latest Ref Rng & Units 05/09/2023    9:29 AM 09/05/2022    9:15 AM 03/07/2022    9:32 AM  CMP  Glucose 70 - 99 mg/dL 469  94  91   BUN 8 - 23 mg/dL 16  23  21    Creatinine 0.44 - 1.00 mg/dL 6.29  5.28   4.13   Sodium 135 - 145 mmol/L 140  137  136   Potassium 3.5 -  5.1 mmol/L 4.4  4.6  4.6   Chloride 98 - 111 mmol/L 104  101  105   CO2 22 - 32 mmol/L 27  24  26    Calcium 8.9 - 10.3 mg/dL 9.4  9.3  9.3   Total Protein 6.5 - 8.1 g/dL 7.5  7.3  7.5   Total Bilirubin 0.0 - 1.2 mg/dL 0.7  0.5  0.7   Alkaline Phos 38 - 126 U/L 74  70  73   AST 15 - 41 U/L 22  19  21    ALT 0 - 44 U/L 20  18  20

## 2023-05-25 ENCOUNTER — Other Ambulatory Visit: Payer: Self-pay | Admitting: Obstetrics and Gynecology

## 2023-05-25 DIAGNOSIS — Z1231 Encounter for screening mammogram for malignant neoplasm of breast: Secondary | ICD-10-CM

## 2023-06-18 ENCOUNTER — Other Ambulatory Visit: Payer: Self-pay | Admitting: Obstetrics and Gynecology

## 2023-06-18 DIAGNOSIS — Z1231 Encounter for screening mammogram for malignant neoplasm of breast: Secondary | ICD-10-CM

## 2023-12-18 ENCOUNTER — Ambulatory Visit
Admission: RE | Admit: 2023-12-18 | Discharge: 2023-12-18 | Disposition: A | Discharge status: Home / Self Care | Source: Ambulatory Visit | Attending: Obstetrics and Gynecology | Admitting: Obstetrics and Gynecology

## 2023-12-18 DIAGNOSIS — Z1231 Encounter for screening mammogram for malignant neoplasm of breast: Secondary | ICD-10-CM | POA: Insufficient documentation

## 2023-12-26 ENCOUNTER — Other Ambulatory Visit: Payer: Self-pay | Admitting: Orthopedic Surgery

## 2023-12-26 DIAGNOSIS — M25559 Pain in unspecified hip: Secondary | ICD-10-CM

## 2024-01-07 ENCOUNTER — Ambulatory Visit
Admission: RE | Admit: 2024-01-07 | Discharge: 2024-01-07 | Disposition: A | Discharge status: Home / Self Care | Source: Ambulatory Visit | Attending: Orthopedic Surgery

## 2024-01-07 ENCOUNTER — Ambulatory Visit
Admission: RE | Admit: 2024-01-07 | Discharge: 2024-01-07 | Disposition: A | Discharge status: Home / Self Care | Source: Ambulatory Visit | Attending: Orthopedic Surgery | Admitting: Orthopedic Surgery

## 2024-01-07 DIAGNOSIS — M25559 Pain in unspecified hip: Secondary | ICD-10-CM
